# Patient Record
Sex: Female | Born: 1935 | Race: White | Hispanic: No | Marital: Single | Smoking: Former smoker
Health system: Southern US, Community
[De-identification: ages and names within clinical notes are randomized; demographics above are authoritative.]

## PROBLEM LIST (undated history)

## (undated) DIAGNOSIS — E119 Type 2 diabetes mellitus without complications: Secondary | ICD-10-CM

## (undated) DIAGNOSIS — R1110 Vomiting, unspecified: Secondary | ICD-10-CM

## (undated) DIAGNOSIS — Z87440 Personal history of urinary (tract) infections: Secondary | ICD-10-CM

## (undated) DIAGNOSIS — G629 Polyneuropathy, unspecified: Secondary | ICD-10-CM

## (undated) DIAGNOSIS — M810 Age-related osteoporosis without current pathological fracture: Secondary | ICD-10-CM

## (undated) DIAGNOSIS — M549 Dorsalgia, unspecified: Secondary | ICD-10-CM

## (undated) DIAGNOSIS — F0390 Unspecified dementia without behavioral disturbance: Secondary | ICD-10-CM

## (undated) DIAGNOSIS — M1990 Unspecified osteoarthritis, unspecified site: Secondary | ICD-10-CM

## (undated) DIAGNOSIS — I509 Heart failure, unspecified: Secondary | ICD-10-CM

## (undated) DIAGNOSIS — I10 Essential (primary) hypertension: Secondary | ICD-10-CM

## (undated) DIAGNOSIS — G5600 Carpal tunnel syndrome, unspecified upper limb: Secondary | ICD-10-CM

## (undated) DIAGNOSIS — K759 Inflammatory liver disease, unspecified: Secondary | ICD-10-CM

## (undated) DIAGNOSIS — I639 Cerebral infarction, unspecified: Secondary | ICD-10-CM

## (undated) DIAGNOSIS — E785 Hyperlipidemia, unspecified: Secondary | ICD-10-CM

## (undated) DIAGNOSIS — K219 Gastro-esophageal reflux disease without esophagitis: Secondary | ICD-10-CM

## (undated) DIAGNOSIS — K5090 Crohn's disease, unspecified, without complications: Secondary | ICD-10-CM

## (undated) HISTORY — DX: Polyneuropathy, unspecified: G62.9

## (undated) HISTORY — DX: Carpal tunnel syndrome, unspecified upper limb: G56.00

## (undated) HISTORY — PX: ELBOW SURGERY: SHX618

## (undated) HISTORY — PX: ABDOMINAL SURGERY: SHX537

## (undated) HISTORY — DX: Heart failure, unspecified: I50.9

## (undated) HISTORY — PX: EYE SURGERY: SHX253

## (undated) HISTORY — DX: Personal history of urinary (tract) infections: Z87.440

## (undated) HISTORY — PX: COLONOSCOPY: SHX174

## (undated) HISTORY — PX: KNEE SURGERY: SHX244

---

## 2001-10-30 ENCOUNTER — Encounter: Admission: RE | Admit: 2001-10-30 | Discharge: 2001-10-30 | Payer: Self-pay

## 2001-11-01 ENCOUNTER — Emergency Department (HOSPITAL_COMMUNITY): Admission: EM | Admit: 2001-11-01 | Discharge: 2001-11-02 | Payer: Self-pay | Admitting: Emergency Medicine

## 2001-11-04 ENCOUNTER — Inpatient Hospital Stay (HOSPITAL_COMMUNITY): Admission: AD | Admit: 2001-11-04 | Discharge: 2001-11-12 | Payer: Self-pay | Admitting: Internal Medicine

## 2012-05-14 ENCOUNTER — Encounter (INDEPENDENT_AMBULATORY_CARE_PROVIDER_SITE_OTHER): Payer: Self-pay | Admitting: Ophthalmology

## 2012-05-22 ENCOUNTER — Encounter (INDEPENDENT_AMBULATORY_CARE_PROVIDER_SITE_OTHER): Payer: Medicare Other | Admitting: Ophthalmology

## 2012-05-22 DIAGNOSIS — I10 Essential (primary) hypertension: Secondary | ICD-10-CM | POA: Diagnosis not present

## 2012-06-06 ENCOUNTER — Other Ambulatory Visit (INDEPENDENT_AMBULATORY_CARE_PROVIDER_SITE_OTHER): Payer: Medicare Other | Admitting: Ophthalmology

## 2012-06-06 DIAGNOSIS — E1139 Type 2 diabetes mellitus with other diabetic ophthalmic complication: Secondary | ICD-10-CM

## 2012-06-06 DIAGNOSIS — E11359 Type 2 diabetes mellitus with proliferative diabetic retinopathy without macular edema: Secondary | ICD-10-CM

## 2012-06-23 ENCOUNTER — Other Ambulatory Visit (INDEPENDENT_AMBULATORY_CARE_PROVIDER_SITE_OTHER): Payer: Medicare Other | Admitting: Ophthalmology

## 2012-06-23 DIAGNOSIS — E1139 Type 2 diabetes mellitus with other diabetic ophthalmic complication: Secondary | ICD-10-CM

## 2012-06-23 DIAGNOSIS — E11359 Type 2 diabetes mellitus with proliferative diabetic retinopathy without macular edema: Secondary | ICD-10-CM

## 2012-08-18 ENCOUNTER — Encounter (HOSPITAL_COMMUNITY): Payer: Self-pay

## 2012-08-18 ENCOUNTER — Emergency Department (HOSPITAL_COMMUNITY): Payer: Medicare Other

## 2012-08-18 ENCOUNTER — Emergency Department (HOSPITAL_COMMUNITY)
Admission: EM | Admit: 2012-08-18 | Discharge: 2012-08-18 | Disposition: A | Discharge status: Home / Self Care | Payer: Medicare Other | Attending: Emergency Medicine | Admitting: Emergency Medicine

## 2012-08-18 DIAGNOSIS — Z87891 Personal history of nicotine dependence: Secondary | ICD-10-CM | POA: Insufficient documentation

## 2012-08-18 DIAGNOSIS — Z79899 Other long term (current) drug therapy: Secondary | ICD-10-CM | POA: Insufficient documentation

## 2012-08-18 DIAGNOSIS — K219 Gastro-esophageal reflux disease without esophagitis: Secondary | ICD-10-CM | POA: Insufficient documentation

## 2012-08-18 DIAGNOSIS — M545 Low back pain, unspecified: Secondary | ICD-10-CM | POA: Insufficient documentation

## 2012-08-18 DIAGNOSIS — M549 Dorsalgia, unspecified: Secondary | ICD-10-CM

## 2012-08-18 DIAGNOSIS — E119 Type 2 diabetes mellitus without complications: Secondary | ICD-10-CM | POA: Insufficient documentation

## 2012-08-18 DIAGNOSIS — Z794 Long term (current) use of insulin: Secondary | ICD-10-CM | POA: Insufficient documentation

## 2012-08-18 DIAGNOSIS — M810 Age-related osteoporosis without current pathological fracture: Secondary | ICD-10-CM | POA: Insufficient documentation

## 2012-08-18 DIAGNOSIS — Z8781 Personal history of (healed) traumatic fracture: Secondary | ICD-10-CM | POA: Insufficient documentation

## 2012-08-18 HISTORY — DX: Gastro-esophageal reflux disease without esophagitis: K21.9

## 2012-08-18 HISTORY — DX: Age-related osteoporosis without current pathological fracture: M81.0

## 2012-08-18 HISTORY — DX: Type 2 diabetes mellitus without complications: E11.9

## 2012-08-18 MED ORDER — HYDROCODONE-ACETAMINOPHEN 5-325 MG PO TABS
1.0000 | ORAL_TABLET | Freq: Once | ORAL | Status: AC
  Administered 2012-08-18: 1 via ORAL
  Filled 2012-08-18: qty 1

## 2012-08-18 MED ORDER — HYDROCODONE-ACETAMINOPHEN 5-325 MG PO TABS
1.0000 | ORAL_TABLET | Freq: Four times a day (QID) | ORAL | Status: DC | PRN
Start: 2012-08-18 — End: 2012-11-05

## 2012-08-18 NOTE — Progress Notes (Signed)
   CARE MANAGEMENT ED NOTE 08/18/2012  Patient:  MARCHE, HOTTENSTEIN   Account Number:  [id number]  Date Initiated:  08/18/2012  Documentation initiated by:  Radford Pax  Subjective/Objective Assessment:   Patient presented to ED with lower back pain     Subjective/Objective Assessment Detail:     Action/Plan:   Action/Plan Detail:   Anticipated DC Date:       Status Recommendation to Physician:   Result of Recommendation:    Other ED Services  Consult Working Plan    DC Planning Services  Other  PCP issues    Choice offered to / List presented to:            Status of service:  Completed, signed off  ED Comments:   ED Comments Detail:  Patient listed as not having a pcp.  EDCM spoke to patient who stated that Dr. Thea Silversmith off of [address] is her pcp.  Family at bedside.  Offered support to patient and family.  No further needs at this time.

## 2012-08-18 NOTE — ED Provider Notes (Signed)
History     CSN: [id number]  Arrival date & time 08/18/12  1243   First MD Initiated Contact with Patient 08/18/12 1505      Chief Complaint  Patient presents with  . Back Pain    (Consider location/radiation/quality/duration/timing/severity/associated sxs/prior treatment) HPI Pt presenting with c/o low back pain.  Pt states she has had hx of low back pain over the past 10 years.  Has hx of 4 lumbar fractures 10 years ago.  This current episode of back pain began approx 1 week ago- she was getting groceries out of her car and had acute onset of back pain.  She denies urinary retention, no weakness of legs, no incontinence of bowel or bladder.  No fever.  She was seen by her PMD and was given rx for muscle relaxer- but she states this has not helped.  She has been taking tramadol chronically for pain over the past several years.  There are no other associated systemic symptoms, there are no other alleviating or modifying factors.   Past Medical History  Diagnosis Date  . Osteoporosis   . Diabetes mellitus without complication   . GERD (gastroesophageal reflux disease)     Past Surgical History  Procedure Laterality Date  . Knee surgery Right   . Elbow surgery Left   . Eye surgery    . Abdominal surgery      History reviewed. No pertinent family history.  History  Substance Use Topics  . Smoking status: Former Games developer  . Smokeless tobacco: Never Used  . Alcohol Use: No    OB History   Grav Para Term Preterm Abortions TAB SAB Ect Mult Living                  Review of Systems ROS reviewed and all otherwise negative except for mentioned in HPI  Allergies  Review of patient's allergies indicates no known allergies.  Home Medications   Current Outpatient Rx  Name  Route  Sig  Dispense  Refill  . amLODipine (NORVASC) 5 MG tablet   Oral   Take 5 mg by mouth daily.         Marland Kitchen aspirin EC 81 MG tablet   Oral   Take 81 mg by mouth daily.         Marland Kitchen atenolol  (TENORMIN) 25 MG tablet   Oral   Take 25 mg by mouth daily.         . Calcium Citrate (CITRACAL PO)   Oral   Take 1,200 Units by mouth daily.         . Cholecalciferol (VITAMIN D3) 2000 UNITS TABS   Oral   Take 1 tablet by mouth daily.         . cyclobenzaprine (FLEXERIL) 10 MG tablet   Oral   Take 5 mg by mouth 3 (three) times daily as needed for muscle spasms (pain).         . ferrous fumarate (HEMOCYTE - 106 MG FE) 325 (106 FE) MG TABS   Oral   Take 1 tablet by mouth every other day.         . furosemide (LASIX) 20 MG tablet   Oral   Take 20 mg by mouth every other day.         . ibuprofen (ADVIL,MOTRIN) 200 MG tablet   Oral   Take 400 mg by mouth every 6 (six) hours as needed for pain (pain).         Marland Kitchen  insulin aspart (NOVOLOG) 100 UNIT/ML injection   Subcutaneous   Inject 4-6 Units into the skin 3 (three) times daily with meals. 5units in am, 4units at lunch, and 6units in pm         . insulin glargine (LANTUS) 100 UNIT/ML injection   Subcutaneous   Inject 18 Units into the skin daily. Takes in the am         . lansoprazole (PREVACID) 30 MG capsule   Oral   Take 30 mg by mouth daily.         . prochlorperazine (COMPAZINE) 5 MG tablet   Oral   Take 5 mg by mouth every 6 (six) hours as needed for nausea (nausea).         . traMADol (ULTRAM) 50 MG tablet   Oral   Take 50 mg by mouth every 6 (six) hours as needed for pain (pain).         Marland Kitchen HYDROcodone-acetaminophen (NORCO/VICODIN) 5-325 MG per tablet   Oral   Take 1 tablet by mouth every 6 (six) hours as needed for pain.   20 tablet   0     BP 143/66  Pulse 84  Temp(Src) 98.7 F (37.1 C) (Oral)  Resp 16  Ht 4\' 9"  (1.448 m)  Wt 122 lb (55.339 kg)  BMI 26.39 kg/m2  SpO2 95% Vitals reviewed Physical Exam Physical Examination: General appearance - alert, well appearing, and in no distress Mental status - alert, oriented to person, place, and time Eyes - no conjunctival  injection, no scleral icterus Mouth - mucous membranes moist, pharynx normal without lesions Neck - no ttp over midline cervical region, FROM wtihout pain Chest - clear to auscultation, no wheezes, rales or rhonchi, symmetric air entry Heart - normal rate, regular rhythm, normal S1, S2, no murmurs, rubs, clicks or gallops Abdomen - soft, nontender, nondistended, no masses or organomegaly Back exam - ttp over lumbar region in the midline, also right parasapinous tenderness, no CVA tenderness Neurological - alert, oriented, normal speech, strength 5/5 in lower extremities bilaterally, sensation intact distally in lower extremites Extremities - peripheral pulses normal, no pedal edema, no clubbing or cyanosis Skin - normal coloration and turgor, no rashes  ED Course  Procedures (including critical care time)  Labs Reviewed - No data to display Dg Lumbar Spine Complete  08/18/2012   *RADIOLOGY REPORT*  Clinical Data: Chronic low back pain.  LUMBAR SPINE - COMPLETE 4+ VIEW  Comparison: None.  Findings: Five non-rib bearing lumbar vertebrae.  Approximately 30% compression deformity of the L3 vertebral body with kyphoplasty material.  Approximately 70% compression deformity of the L4 vertebral body with kyphoplasty material.  Approximately 60% compression deformity of the L1 vertebral body with mild bony retropulsion and no acute fracture lines.  This is fused with an approximately 25% compressed T12 vertebral body.  There is also an approximately 30% T10 vertebral compression deformity.  No subluxations are seen.  No pars defects.  Atheromatous arterial calcifications.  Facet degenerative changes at multiple levels.  IMPRESSION:  1.  Multiple vertebral compression fractures, as described above, with no visible acute fracture lines. 2.  Multilevel facet degenerative changes.   Original Report Authenticated By: Beckie Salts, M.D.     1. Back pain       MDM  Pt with hx of chronic back pain and  chronic compression fractures in mutliple lumbar vertebrae presents with increased back pain over the past week.  Muscle relaxers have not been helping.  Xray shows chronic compression fractures without acute change or loss of height.  Pt states hydrocodone has helped her pain.  She has no symptoms of cauda equina or epidural abscess.  All results d/w patient and family at bedside and questions answered. Discharged with strict return precautions.  Pt agreeable with plan.        Ethelda Chick, MD 08/18/12 [phone]

## 2012-08-18 NOTE — ED Notes (Signed)
Pt escorted to discharge window. Verbalized understanding discharge instructions. In no acute distress.   

## 2012-08-18 NOTE — ED Notes (Signed)
Patient transported to X-ray 

## 2012-08-18 NOTE — ED Notes (Addendum)
Pt c/o intermittent low back pain x "over a week."  Pain score 10/10.  Sts "I was bending over to get groceries out of the back seat of my car and I couldn't stand back up."  Sts she fell forward into the back seat.  Sts she was seen by PCP, last Wednesday, told that it was musculoskeletal and prescribed muscle relaxers.  Sts no relief with medication.  Sts unable to ambulate independently and unable to get "up and down" without assistance.  Denies numbness and tingling.  Pt sts fractures in L3-L6.  Sts "they put cement in, but it didn't help."

## 2012-08-28 ENCOUNTER — Other Ambulatory Visit: Payer: Self-pay | Admitting: Internal Medicine

## 2012-08-28 DIAGNOSIS — R0989 Other specified symptoms and signs involving the circulatory and respiratory systems: Secondary | ICD-10-CM

## 2012-10-01 ENCOUNTER — Ambulatory Visit
Admission: RE | Admit: 2012-10-01 | Discharge: 2012-10-01 | Disposition: A | Discharge status: Home / Self Care | Payer: Medicare Other | Source: Ambulatory Visit | Attending: Internal Medicine | Admitting: Internal Medicine

## 2012-10-01 DIAGNOSIS — R0989 Other specified symptoms and signs involving the circulatory and respiratory systems: Secondary | ICD-10-CM

## 2012-10-24 ENCOUNTER — Ambulatory Visit (INDEPENDENT_AMBULATORY_CARE_PROVIDER_SITE_OTHER): Payer: Medicare Other | Admitting: Ophthalmology

## 2012-11-05 ENCOUNTER — Emergency Department (HOSPITAL_COMMUNITY): Payer: Medicare Other

## 2012-11-05 ENCOUNTER — Encounter (HOSPITAL_COMMUNITY): Payer: Self-pay

## 2012-11-05 ENCOUNTER — Emergency Department (HOSPITAL_COMMUNITY)
Admission: EM | Admit: 2012-11-05 | Discharge: 2012-11-05 | Disposition: A | Discharge status: Home / Self Care | Payer: Medicare Other | Attending: Emergency Medicine | Admitting: Emergency Medicine

## 2012-11-05 DIAGNOSIS — Z87891 Personal history of nicotine dependence: Secondary | ICD-10-CM | POA: Insufficient documentation

## 2012-11-05 DIAGNOSIS — W19XXXA Unspecified fall, initial encounter: Secondary | ICD-10-CM

## 2012-11-05 DIAGNOSIS — S20229A Contusion of unspecified back wall of thorax, initial encounter: Secondary | ICD-10-CM | POA: Insufficient documentation

## 2012-11-05 DIAGNOSIS — G8929 Other chronic pain: Secondary | ICD-10-CM | POA: Insufficient documentation

## 2012-11-05 DIAGNOSIS — R51 Headache: Secondary | ICD-10-CM | POA: Insufficient documentation

## 2012-11-05 DIAGNOSIS — Z7982 Long term (current) use of aspirin: Secondary | ICD-10-CM | POA: Insufficient documentation

## 2012-11-05 DIAGNOSIS — Z79899 Other long term (current) drug therapy: Secondary | ICD-10-CM | POA: Insufficient documentation

## 2012-11-05 DIAGNOSIS — Z794 Long term (current) use of insulin: Secondary | ICD-10-CM | POA: Insufficient documentation

## 2012-11-05 DIAGNOSIS — Y9301 Activity, walking, marching and hiking: Secondary | ICD-10-CM | POA: Insufficient documentation

## 2012-11-05 DIAGNOSIS — Y9289 Other specified places as the place of occurrence of the external cause: Secondary | ICD-10-CM | POA: Insufficient documentation

## 2012-11-05 DIAGNOSIS — E119 Type 2 diabetes mellitus without complications: Secondary | ICD-10-CM | POA: Insufficient documentation

## 2012-11-05 DIAGNOSIS — K219 Gastro-esophageal reflux disease without esophagitis: Secondary | ICD-10-CM | POA: Insufficient documentation

## 2012-11-05 DIAGNOSIS — T07XXXA Unspecified multiple injuries, initial encounter: Secondary | ICD-10-CM

## 2012-11-05 DIAGNOSIS — W010XXA Fall on same level from slipping, tripping and stumbling without subsequent striking against object, initial encounter: Secondary | ICD-10-CM | POA: Insufficient documentation

## 2012-11-05 DIAGNOSIS — M810 Age-related osteoporosis without current pathological fracture: Secondary | ICD-10-CM | POA: Insufficient documentation

## 2012-11-05 HISTORY — DX: Dorsalgia, unspecified: M54.9

## 2012-11-05 MED ORDER — HYDROCODONE-ACETAMINOPHEN 5-325 MG PO TABS
1.0000 | ORAL_TABLET | Freq: Once | ORAL | Status: AC
  Administered 2012-11-05: 1 via ORAL
  Filled 2012-11-05: qty 1

## 2012-11-05 MED ORDER — HYDROCODONE-ACETAMINOPHEN 5-325 MG PO TABS: 2.0000 | ORAL_TABLET | ORAL | Status: DC | PRN

## 2012-11-05 NOTE — ED Notes (Signed)
CBG 100 

## 2012-11-05 NOTE — ED Notes (Signed)
Patient transported to X-ray 

## 2012-11-05 NOTE — ED Notes (Signed)
Pt did not have IV.

## 2012-11-05 NOTE — ED Provider Notes (Signed)
CSN: [id number]     Arrival date & time 11/05/12  1518 History   First MD Initiated Contact with Patient 11/05/12 1530     Chief Complaint  Patient presents with  . Fall   (Consider location/radiation/quality/duration/timing/severity/associated sxs/prior Treatment) Patient is a 79 y.o. female presenting with fall. The history is provided by the patient.  Fall This is a new problem. The current episode started less than 1 hour ago. The problem occurs every several days. The problem has not changed since onset.Associated symptoms include headaches. Pertinent negatives include no abdominal pain and no shortness of breath.  pt tripped on a curb, no head injury or loc--h/o chronic back pain and pain to thoracic and lumbar area worse, denies LE weakness or paredthesisas--no change in bowel/bladder fx--ems called and pt placed on backboard and head blocks and transported here--notes midline cervical neck pain without UE numbness or weakness--pain is described as sharp and worse with movement--no meds for this taken pta  Past Medical History  Diagnosis Date  . Osteoporosis   . Diabetes mellitus without complication   . GERD (gastroesophageal reflux disease)    Past Surgical History  Procedure Laterality Date  . Knee surgery Right   . Elbow surgery Left   . Eye surgery    . Abdominal surgery     No family history on file. History  Substance Use Topics  . Smoking status: Former Games developer  . Smokeless tobacco: Never Used  . Alcohol Use: No   OB History   Grav Para Term Preterm Abortions TAB SAB Ect Mult Living                 Review of Systems  Respiratory: Negative for shortness of breath.   Gastrointestinal: Negative for abdominal pain.  Neurological: Positive for headaches.  All other systems reviewed and are negative.    Allergies  Review of patient's allergies indicates no known allergies.  Home Medications   Current Outpatient Rx  Name  Route  Sig  Dispense  Refill  .  amLODipine (NORVASC) 5 MG tablet   Oral   Take 5 mg by mouth daily.         Marland Kitchen aspirin EC 81 MG tablet   Oral   Take 81 mg by mouth daily.         Marland Kitchen atenolol (TENORMIN) 25 MG tablet   Oral   Take 25 mg by mouth daily.         . Calcium Citrate (CITRACAL PO)   Oral   Take 1,200 Units by mouth daily.         . Cholecalciferol (VITAMIN D3) 2000 UNITS TABS   Oral   Take 1 tablet by mouth daily.         . cyclobenzaprine (FLEXERIL) 10 MG tablet   Oral   Take 5 mg by mouth 3 (three) times daily as needed for muscle spasms (pain).         . ferrous fumarate (HEMOCYTE - 106 MG FE) 325 (106 FE) MG TABS   Oral   Take 1 tablet by mouth every other day.         . furosemide (LASIX) 20 MG tablet   Oral   Take 20 mg by mouth every other day.         Marland Kitchen HYDROcodone-acetaminophen (NORCO/VICODIN) 5-325 MG per tablet   Oral   Take 1 tablet by mouth every 6 (six) hours as needed for pain.   20 tablet  0   . ibuprofen (ADVIL,MOTRIN) 200 MG tablet   Oral   Take 400 mg by mouth every 6 (six) hours as needed for pain (pain).         . insulin aspart (NOVOLOG) 100 UNIT/ML injection   Subcutaneous   Inject 4-6 Units into the skin 3 (three) times daily with meals. 5units in am, 4units at lunch, and 6units in pm         . insulin glargine (LANTUS) 100 UNIT/ML injection   Subcutaneous   Inject 18 Units into the skin daily. Takes in the am         . lansoprazole (PREVACID) 30 MG capsule   Oral   Take 30 mg by mouth daily.         . prochlorperazine (COMPAZINE) 5 MG tablet   Oral   Take 5 mg by mouth every 6 (six) hours as needed for nausea (nausea).         . traMADol (ULTRAM) 50 MG tablet   Oral   Take 50 mg by mouth every 6 (six) hours as needed for pain (pain).          BP 134/54  Pulse 57  Temp(Src) 97.5 F (36.4 C) (Oral)  Resp 14  SpO2 93% Physical Exam  Nursing note and vitals reviewed. Constitutional: She is oriented to person, place, and  time. She appears well-developed and well-nourished.  Non-toxic appearance. No distress.  HENT:  Head: Normocephalic and atraumatic.  Eyes: Conjunctivae, EOM and lids are normal. Pupils are equal, round, and reactive to light.  Neck: Normal range of motion. Neck supple. Spinous process tenderness and muscular tenderness present. No tracheal deviation present. No mass present.    Cardiovascular: Normal rate, regular rhythm and normal heart sounds.  Exam reveals no gallop.   No murmur heard. Pulmonary/Chest: Effort normal and breath sounds normal. No stridor. No respiratory distress. She has no decreased breath sounds. She has no wheezes. She has no rhonchi. She has no rales.  Abdominal: Soft. Normal appearance and bowel sounds are normal. She exhibits no distension. There is no tenderness. There is no rebound and no CVA tenderness.  Musculoskeletal: Normal range of motion. She exhibits no edema and no tenderness.       Back:  Neurological: She is alert and oriented to person, place, and time. She has normal strength. No cranial nerve deficit or sensory deficit. GCS eye subscore is 4. GCS verbal subscore is 5. GCS motor subscore is 6.  Skin: Skin is warm and dry. No abrasion and no rash noted.  Psychiatric: She has a normal mood and affect. Her speech is normal and behavior is normal.    ED Course  Procedures (including critical care time) Labs Review Labs Reviewed  GLUCOSE, CAPILLARY - Abnormal; Notable for the following:    Glucose-Capillary 59 (*)    All other components within normal limits   Imaging Review No results found.  MDM  No diagnosis found. Patient's x-rays was negative acute fractures. Given Vicodin for pain here and will be given prescription for same and will followup with her Dr.    Toy Baker, MD 11/05/12 [phone]

## 2012-11-05 NOTE — ED Notes (Signed)
Per EMS- Patient was walking with a cane and lost her balance when attempting to step up on a curb. Patient fell backlanding on her back. Patient c/o increased back pain, but denies hitting her head or having LOC. Patient has a history of chronic back pain and states the pain is much worse.

## 2012-11-05 NOTE — ED Notes (Signed)
Bed: WU98 Expected date:  Expected time:  Means of arrival:  Comments: Fall, back pain

## 2012-11-10 NOTE — Progress Notes (Signed)
EDCM notified to contact patient Janet Frazier daughter Janet Frazier at [phone].  EDCM called Janet Frazier.  As per Janet Frazier, patient was in the ED on Wednesday after having a fall.  She reports she asked physician in ED to have PT at home, but the physician in the ED told her to contact the patient's pcp.  As per Janet Frazier, she called her mother's pcp and he stated that he would not be able to order anything unless he saw the patient first.  Janet Frazier reports that she has hired someone to help her mother in the home but,  "It's like a babysitter and she needs more than that."  [facility] provided Janet Frazier with phone numbers for [facility] and Physician Home visits.  EDCM also provided phone numbers for the following home health agencies: [facility], [place], [place] and [place].  Instructed patient's daughter to call the home health agencies and inform them of her situation and that she is interested in having home health care.  Janet Frazier thankful for resources.  No further needs at this time.

## 2012-11-12 ENCOUNTER — Ambulatory Visit (INDEPENDENT_AMBULATORY_CARE_PROVIDER_SITE_OTHER): Payer: Medicare Other | Admitting: Ophthalmology

## 2012-11-12 ENCOUNTER — Emergency Department (HOSPITAL_COMMUNITY): Payer: Medicare Other

## 2012-11-12 ENCOUNTER — Observation Stay (HOSPITAL_COMMUNITY)
Admission: EM | Admit: 2012-11-12 | Discharge: 2012-11-14 | Disposition: A | Discharge status: Home / Self Care | Payer: Medicare Other | Attending: Internal Medicine | Admitting: Internal Medicine

## 2012-11-12 ENCOUNTER — Encounter (HOSPITAL_COMMUNITY): Payer: Self-pay

## 2012-11-12 DIAGNOSIS — R296 Repeated falls: Secondary | ICD-10-CM

## 2012-11-12 DIAGNOSIS — X58XXXA Exposure to other specified factors, initial encounter: Secondary | ICD-10-CM | POA: Insufficient documentation

## 2012-11-12 DIAGNOSIS — E119 Type 2 diabetes mellitus without complications: Secondary | ICD-10-CM | POA: Diagnosis present

## 2012-11-12 DIAGNOSIS — Z7982 Long term (current) use of aspirin: Secondary | ICD-10-CM | POA: Insufficient documentation

## 2012-11-12 DIAGNOSIS — G8929 Other chronic pain: Principal | ICD-10-CM | POA: Insufficient documentation

## 2012-11-12 DIAGNOSIS — Z794 Long term (current) use of insulin: Secondary | ICD-10-CM | POA: Insufficient documentation

## 2012-11-12 DIAGNOSIS — I517 Cardiomegaly: Secondary | ICD-10-CM | POA: Insufficient documentation

## 2012-11-12 DIAGNOSIS — E860 Dehydration: Secondary | ICD-10-CM | POA: Insufficient documentation

## 2012-11-12 DIAGNOSIS — R079 Chest pain, unspecified: Secondary | ICD-10-CM | POA: Insufficient documentation

## 2012-11-12 DIAGNOSIS — S22009A Unspecified fracture of unspecified thoracic vertebra, initial encounter for closed fracture: Secondary | ICD-10-CM | POA: Insufficient documentation

## 2012-11-12 DIAGNOSIS — R5381 Other malaise: Secondary | ICD-10-CM | POA: Insufficient documentation

## 2012-11-12 DIAGNOSIS — M549 Dorsalgia, unspecified: Secondary | ICD-10-CM | POA: Diagnosis present

## 2012-11-12 DIAGNOSIS — K219 Gastro-esophageal reflux disease without esophagitis: Secondary | ICD-10-CM | POA: Insufficient documentation

## 2012-11-12 DIAGNOSIS — S22000A Wedge compression fracture of unspecified thoracic vertebra, initial encounter for closed fracture: Secondary | ICD-10-CM

## 2012-11-12 DIAGNOSIS — Z9181 History of falling: Secondary | ICD-10-CM | POA: Insufficient documentation

## 2012-11-12 DIAGNOSIS — M4000 Postural kyphosis, site unspecified: Secondary | ICD-10-CM | POA: Insufficient documentation

## 2012-11-12 DIAGNOSIS — I10 Essential (primary) hypertension: Secondary | ICD-10-CM | POA: Diagnosis present

## 2012-11-12 DIAGNOSIS — M810 Age-related osteoporosis without current pathological fracture: Secondary | ICD-10-CM | POA: Insufficient documentation

## 2012-11-12 DIAGNOSIS — Z79899 Other long term (current) drug therapy: Secondary | ICD-10-CM | POA: Insufficient documentation

## 2012-11-12 DIAGNOSIS — E876 Hypokalemia: Secondary | ICD-10-CM | POA: Diagnosis present

## 2012-11-12 LAB — BASIC METABOLIC PANEL
BUN: 20 mg/dL (ref 6–23)
Creatinine, Ser: 0.52 mg/dL (ref 0.50–1.10)
GFR calc Af Amer: >90 mL/min (ref >90)
GFR calc non Af Amer: 88 mL/min — ABNORMAL LOW (ref >90)
Glucose, Bld: 197 mg/dL — ABNORMAL HIGH (ref 70–99)

## 2012-11-12 LAB — CBC WITH DIFFERENTIAL/PLATELET
Hemoglobin: 13.8 g/dL (ref 12.0–15.0)
Lymphocytes Relative: 20 % (ref 12–46)
Lymphs Abs: 2 10*3/uL (ref 0.7–4.0)
MCV: 84.9 fL (ref 78.0–100.0)
Monocytes Relative: 9 % (ref 3–12)
Neutrophils Relative %: 72 % (ref 43–77)
Platelets: 258 10*3/uL (ref 150–400)
RBC: 4.71 MIL/uL (ref 3.87–5.11)
WBC: 10.2 10*3/uL (ref 4.0–10.5)

## 2012-11-12 LAB — GLUCOSE, CAPILLARY

## 2012-11-12 LAB — URINALYSIS, ROUTINE W REFLEX MICROSCOPIC
Glucose, UA: 250 mg/dL — AB
Hgb urine dipstick: NEGATIVE
Leukocytes, UA: NEGATIVE
pH: 6 (ref 5.0–8.0)

## 2012-11-12 LAB — URINE MICROSCOPIC-ADD ON

## 2012-11-12 MED ORDER — AMLODIPINE BESYLATE 5 MG PO TABS
5.0000 mg | ORAL_TABLET | Freq: Every day | ORAL | Status: DC
  Administered 2012-11-12: 5 mg via ORAL
  Filled 2012-11-12 (×2): qty 1

## 2012-11-12 MED ORDER — INSULIN ASPART 100 UNIT/ML ~~LOC~~ SOLN
0.0000 [IU] | Freq: Three times a day (TID) | SUBCUTANEOUS | Status: DC
  Administered 2012-11-13: 2 [IU] via SUBCUTANEOUS
  Administered 2012-11-13: 3 [IU] via SUBCUTANEOUS
  Administered 2012-11-13: 2 [IU] via SUBCUTANEOUS
  Administered 2012-11-14: 5 [IU] via SUBCUTANEOUS
  Administered 2012-11-14: 1 [IU] via SUBCUTANEOUS

## 2012-11-12 MED ORDER — POTASSIUM CHLORIDE CRYS ER 20 MEQ PO TBCR
40.0000 meq | EXTENDED_RELEASE_TABLET | Freq: Once | ORAL | Status: AC
  Administered 2012-11-12: 40 meq via ORAL
  Filled 2012-11-12: qty 2

## 2012-11-12 MED ORDER — OXYCODONE HCL 5 MG PO TABS
5.0000 mg | ORAL_TABLET | Freq: Four times a day (QID) | ORAL | Status: DC | PRN
  Administered 2012-11-12 – 2012-11-14 (×5): 5 mg via ORAL
  Filled 2012-11-12 (×5): qty 1

## 2012-11-12 MED ORDER — PANTOPRAZOLE SODIUM 20 MG PO TBEC
20.0000 mg | DELAYED_RELEASE_TABLET | Freq: Every day | ORAL | Status: DC
  Filled 2012-11-12: qty 1

## 2012-11-12 MED ORDER — SODIUM CHLORIDE 0.9 % IV SOLN
INTRAVENOUS | Status: DC
  Administered 2012-11-12: 18:00:00 via INTRAVENOUS

## 2012-11-12 MED ORDER — ENOXAPARIN SODIUM 40 MG/0.4ML ~~LOC~~ SOLN
40.0000 mg | SUBCUTANEOUS | Status: DC
  Administered 2012-11-12 – 2012-11-13 (×2): 40 mg via SUBCUTANEOUS
  Filled 2012-11-12 (×3): qty 0.4

## 2012-11-12 MED ORDER — FENTANYL CITRATE 0.05 MG/ML IJ SOLN
25.0000 ug | Freq: Once | INTRAMUSCULAR | Status: AC
  Administered 2012-11-12: 25 ug via INTRAVENOUS
  Filled 2012-11-12: qty 2

## 2012-11-12 MED ORDER — ATENOLOL 25 MG PO TABS
25.0000 mg | ORAL_TABLET | Freq: Every day | ORAL | Status: DC
  Administered 2012-11-12 – 2012-11-14 (×3): 25 mg via ORAL
  Filled 2012-11-12 (×3): qty 1

## 2012-11-12 MED ORDER — ACETAMINOPHEN 650 MG RE SUPP: 650.0000 mg | Freq: Four times a day (QID) | RECTAL | Status: DC | PRN

## 2012-11-12 MED ORDER — ONDANSETRON HCL 4 MG/2ML IJ SOLN: 4.0000 mg | Freq: Four times a day (QID) | INTRAMUSCULAR | Status: DC | PRN

## 2012-11-12 MED ORDER — CYCLOBENZAPRINE HCL 5 MG PO TABS
5.0000 mg | ORAL_TABLET | Freq: Three times a day (TID) | ORAL | Status: DC | PRN
  Filled 2012-11-12: qty 1

## 2012-11-12 MED ORDER — ASPIRIN EC 81 MG PO TBEC
81.0000 mg | DELAYED_RELEASE_TABLET | ORAL | Status: DC
  Administered 2012-11-12 – 2012-11-14 (×2): 81 mg via ORAL
  Filled 2012-11-12 (×2): qty 1

## 2012-11-12 MED ORDER — INSULIN GLARGINE 100 UNIT/ML ~~LOC~~ SOLN
10.0000 [IU] | Freq: Every day | SUBCUTANEOUS | Status: DC
  Administered 2012-11-13 – 2012-11-14 (×2): 10 [IU] via SUBCUTANEOUS
  Filled 2012-11-12 (×2): qty 0.1

## 2012-11-12 MED ORDER — ACETAMINOPHEN 325 MG PO TABS: 650.0000 mg | ORAL_TABLET | Freq: Four times a day (QID) | ORAL | Status: DC | PRN

## 2012-11-12 MED ORDER — SODIUM CHLORIDE 0.9 % IV SOLN
INTRAVENOUS | Status: DC
  Administered 2012-11-12 – 2012-11-14 (×3): via INTRAVENOUS

## 2012-11-12 MED ORDER — ONDANSETRON HCL 4 MG PO TABS: 4.0000 mg | ORAL_TABLET | Freq: Four times a day (QID) | ORAL | Status: DC | PRN

## 2012-11-12 NOTE — ED Provider Notes (Signed)
CSN: [id number]     Arrival date & time 11/12/12  1504 History   First MD Initiated Contact with Patient 11/12/12 1533     Chief Complaint  Patient presents with  . Back Pain   (Consider location/radiation/quality/duration/timing/severity/associated sxs/prior Treatment) Patient is a 79 y.o. female presenting with back pain. The history is provided by the patient and a relative.  Back Pain Associated symptoms: no abdominal pain, no chest pain, no fever, no headaches, no numbness and no weakness    patient had a fall around a week ago. She has chronic back pain is usually well-controlled the tramadol. She had x-rays that did not show any acute fracture. She was discharged with Vicodin. She's continued to have pain at home. She's been minimally and auditory do to it. She states the pain limits her. She's also had decreased energy. Her sugars have been more difficult to control. Been going down to the 60s. She's had decreased appetite. She's had an occasional cough. No dysuria. No urinary incontinence. No abdominal pain. No chest pain. She's normally could either. Patient's family states that her primary care Dr., Dr. Thea Silversmith stated that she needed to come in to the ER for 3 days so she can't place to the nursing home or rehabilitation.  Past Medical History  Diagnosis Date  . Osteoporosis   . Diabetes mellitus without complication   . GERD (gastroesophageal reflux disease)   . Back pain    Past Surgical History  Procedure Laterality Date  . Knee surgery Right   . Elbow surgery Left   . Eye surgery    . Abdominal surgery     History reviewed. No pertinent family history. History  Substance Use Topics  . Smoking status: Former Games developer  . Smokeless tobacco: Never Used  . Alcohol Use: No   OB History   Grav Para Term Preterm Abortions TAB SAB Ect Mult Living                 Review of Systems  Constitutional: Positive for activity change, appetite change and fatigue. Negative for  fever.  HENT: Positive for neck pain. Negative for neck stiffness.   Eyes: Negative for pain.  Respiratory: Negative for chest tightness and shortness of breath.   Cardiovascular: Negative for chest pain and leg swelling.  Gastrointestinal: Negative for nausea, vomiting, abdominal pain and diarrhea.  Genitourinary: Negative for flank pain.  Musculoskeletal: Positive for back pain and gait problem. Negative for joint swelling.  Skin: Negative for rash.  Neurological: Negative for weakness, numbness and headaches.  Psychiatric/Behavioral: Negative for behavioral problems.    Allergies  Review of patient's allergies indicates no known allergies.  Home Medications   No current outpatient prescriptions on file. BP 162/64  Pulse 83  Temp(Src) 98.7 F (37.1 C) (Oral)  Resp 18  SpO2 92% Physical Exam  Constitutional: She is oriented to person, place, and time. She appears well-developed.  HENT:  Head: Normocephalic and atraumatic.  Eyes: Pupils are equal, round, and reactive to light.  Cardiovascular: Normal rate.   Pulmonary/Chest: Effort normal and breath sounds normal.  Abdominal: Soft.  Musculoskeletal: She exhibits tenderness.  Mild tenderness of the entire cervical thoracic and lumbar spine without step-off deformity.  Neurological: She is alert and oriented to person, place, and time.  Skin: Skin is warm.    ED Course  Procedures (including critical care time) Labs Review Labs Reviewed  URINALYSIS, ROUTINE W REFLEX MICROSCOPIC - Abnormal; Notable for the following:    Glucose,  UA 250 (*)    Ketones, ur 15 (*)    Protein, ur 30 (*)    All other components within normal limits  BASIC METABOLIC PANEL - Abnormal; Notable for the following:    Sodium 134 (*)    Potassium 3.2 (*)    Chloride 93 (*)    CO2 33 (*)    Glucose, Bld 197 (*)    GFR calc non Af Amer 88 (*)    All other components within normal limits  GLUCOSE, CAPILLARY - Abnormal; Notable for the following:     Glucose-Capillary 252 (*)    All other components within normal limits  CBC WITH DIFFERENTIAL  URINE MICROSCOPIC-ADD ON  BASIC METABOLIC PANEL  CBC  HEMOGLOBIN A1C   Imaging Review Dg Chest 2 View  11/12/2012   *RADIOLOGY REPORT*  Clinical Data: Weakness, left rib pain  CHEST - 2 VIEW  Comparison: 11/05/2012 thoracic spine exam  Findings: Cardiomegaly evident with diffuse chronic interstitial changes.  No definite edema.  Mitral valve annular calcifications noted.  No CHF or focal pneumonia.  Minimal left base atelectasis with an elevated left hemidiaphragm.  No effusion or pneumothorax. Bones are osteopenic.  Lower thoracic compression fracture with increased kyphosis noted on the lateral view.  Previous lumbar vertebral augmentation changes.  IMPRESSION: Cardiomegaly without CHF  Chronic interstitial changes and left base atelectasis.   Original Report Authenticated By: Judie Petit. Miles Costain, M.D.    MDM   1. Back pain   2. Thoracic compression fracture, closed, initial encounter   3. Dehydration   4. Diabetes mellitus   5. Essential hypertension, benign   6. Hypokalemia   7. Recurrent falls    Patient with back pain. Has thoracic compression fractures of unknown age. Also some dehydration. She's been poorly able to manage at home. She does have some mild "abnormalities. Her pain is poorly controlled with oral medications at home. She'll be admitted as an observation for further treatment.    Juliet Rude. Rubin Payor, MD 11/13/12 [phone]

## 2012-11-12 NOTE — ED Notes (Signed)
Patient transported to X-ray 

## 2012-11-12 NOTE — ED Notes (Addendum)
Per EMS, Pt, from home, c/o back pain x 1 week.  Pain score 4/10.  Pt was seen at [facility] for same complaint after a fall x 1 week ago.  Pt was being evaluated for home health care when EMS arrived.  EMS sts they were told by the Home Health worker that Pt could not get out of bed, due to pain.  EMS sts Pt ambulated without difficulty w/ 1 person assist to stretcher.  EMS sts family wanted Pt brought into ED, because they think that she will be sent to a rehab facility.  Family was upset that Pt was discharged home on previous visit.  Pt sts she has not been taking prescribed pain medication, because it makes her nauseated.  Vitals are stable.  NAD noted.

## 2012-11-12 NOTE — H&P (Signed)
[facility] History and Physical  Janet Frazier BJY:[id number] DOB: 04/01/1930 DOA: 11/12/2012  Referring physician: Dr. Rubin Payor PCP: Thayer Headings, MD  Specialists: none  Chief Complaint: back pain  HPI: Janet Frazier is a 79 y.o. female has a past medical history significant for chronic severe back pain, deconditioning, IDDM, HTN presents to the ED with a chief complaint of worsening back pain over the past week. She has a history of chronic back pain with numerous age indeterminate thoracic spine compression fractures. Her back pain is usually controlled by tramadol, however in the last week has found that not working anymore. She has also become progressively more deconditioned, and family in the room endorses 4 recent ground-level falls, most recent one last week. She was in the ED at that time. Patient and family state that she has been having poor by mouth intake in the past week, do to a combination of worsening pain and decreased ambulatory status. They have a hard time caring for the patient at home, spends a great portion of the day alone. In the emergency room, blood work shows minimal evidence of dehydration. There was a reported oxygen saturation of 89% on room air by EDP, patient asymptomatic. When I interviewed the patient in the ED, her blood pressure was 191/66. Patient denies any chest pain, breathing difficulties, denies any fever or chills. She is now gone no pain, nausea or vomiting. She denies any burning with urination or dysuria.   Family is having more difficulties in caring for the patient at home. ED case manager had a detailed discussion with patient as family.   Review of Systems:  as per history of present illness, otherwise negative   Past Medical History  Diagnosis Date  . Osteoporosis   . Diabetes mellitus without complication   . GERD (gastroesophageal reflux disease)   . Back pain    Past Surgical History  Procedure Laterality Date  . Knee  surgery Right   . Elbow surgery Left   . Eye surgery    . Abdominal surgery     Social History:  reports that she has quit smoking. She has never used smokeless tobacco. She reports that she does not drink alcohol or use illicit drugs.  No Known Allergies  History reviewed. No pertinent family history.   Prior to Admission medications   Medication Sig Start Date End Date Taking? Authorizing Provider  amLODipine (NORVASC) 5 MG tablet Take 5 mg by mouth daily.   Yes Historical Provider, MD  aspirin EC 81 MG tablet Take 81 mg by mouth every other day.    Yes Historical Provider, MD  atenolol (TENORMIN) 25 MG tablet Take 25 mg by mouth daily.   Yes Historical Provider, MD  calcium carbonate (OS-CAL) 600 MG TABS tablet Take 600 mg by mouth daily with breakfast.   Yes Historical Provider, MD  Cholecalciferol (VITAMIN D3) 2000 UNITS TABS Take 1 tablet by mouth daily.   Yes Historical Provider, MD  cyclobenzaprine (FLEXERIL) 10 MG tablet Take 5 mg by mouth 3 (three) times daily as needed for muscle spasms (pai).    Yes Historical Provider, MD  denosumab (PROLIA) 60 MG/ML SOLN injection Inject 60 mg into the skin every 6 (six) months. Administer in upper arm, thigh, or abdomen   Yes Historical Provider, MD  ferrous fumarate (HEMOCYTE - 106 MG FE) 325 (106 FE) MG TABS Take 1 tablet by mouth every other day.   Yes Historical Provider, MD  furosemide (LASIX) 20 MG tablet  Take 20 mg by mouth every other day.   Yes Historical Provider, MD  HYDROcodone-acetaminophen (NORCO/VICODIN) 5-325 MG per tablet Take 2 tablets by mouth every 4 (four) hours as needed for pain. 11/05/12  Yes Toy Baker, MD  ibuprofen (ADVIL,MOTRIN) 200 MG tablet Take 400 mg by mouth every 6 (six) hours as needed for pain.    Yes Historical Provider, MD  insulin aspart (NOVOLOG) 100 UNIT/ML injection Inject 4-6 Units into the skin 3 (three) times daily with meals. 5units in am, 4units at lunch, and 6units in pm   Yes Historical  Provider, MD  insulin glargine (LANTUS) 100 UNIT/ML injection Inject 20 Units into the skin daily. Takes in the am   Yes Historical Provider, MD  lansoprazole (PREVACID) 30 MG capsule Take 30 mg by mouth daily.   Yes Historical Provider, MD  neomycin-polymyxin-pramoxine (NEOSPORIN PLUS) 1 % cream Apply 1 application topically 2 (two) times daily as needed (applied to wounds).    Yes Historical Provider, MD  prochlorperazine (COMPAZINE) 5 MG tablet Take 5 mg by mouth every 6 (six) hours as needed for nausea.    Yes Historical Provider, MD  traMADol (ULTRAM) 50 MG tablet Take 50 mg by mouth every 6 (six) hours as needed for pain.    Yes Historical Provider, MD   Physical Exam: Filed Vitals:   11/12/12 1511 11/12/12 2013 11/12/12 2100  BP: 163/85 191/66 162/64  Pulse: 69 62 83  Temp: 98.7 F (37.1 C)    TempSrc: Oral  Oral  Resp: 18 20 18   SpO2: 92% 90% 92%     General:  No apparent distress, frail-looking Caucasian female   Eyes: PERRL, EOMI, no scleral icterus  ENT: moist oropharynx  Neck: supple, no JVD  Cardiovascular: regular rate without MRG; 2+ peripheral pulses  Respiratory: CTA biL,   Abdomen: soft, non tender to palpation, positive bowel sounds, no guarding, no rebound  Skin: no rashes  Musculoskeletal: no peripheral edema  Psychiatric: normal mood and affect  Neurologic: Nonfocal  Labs on Admission:  Basic Metabolic Panel:  Recent Labs Lab 11/12/12 1835  NA 134*  K 3.2*  CL 93*  CO2 33*  GLUCOSE 197*  BUN 20  CREATININE 0.52  CALCIUM 8.6   CBC:  Recent Labs Lab 11/12/12 1835  WBC 10.2  NEUTROABS 7.3  HGB 13.8  HCT 40.0  MCV 84.9  PLT 258   Radiological Exams on Admission: Dg Chest 2 View  11/12/2012   *RADIOLOGY REPORT*  Clinical Data: Weakness, left rib pain  CHEST - 2 VIEW  Comparison: 11/05/2012 thoracic spine exam  Findings: Cardiomegaly evident with diffuse chronic interstitial changes.  No definite edema.  Mitral valve annular  calcifications noted.  No CHF or focal pneumonia.  Minimal left base atelectasis with an elevated left hemidiaphragm.  No effusion or pneumothorax. Bones are osteopenic.  Lower thoracic compression fracture with increased kyphosis noted on the lateral view.  Previous lumbar vertebral augmentation changes.  IMPRESSION: Cardiomegaly without CHF  Chronic interstitial changes and left base atelectasis.   Original Report Authenticated By: Judie Petit. Miles Costain, M.D.   DG Thoracic (11/05/12) Marked osteopenia with numerous age indeterminate thoracic spine  compression fractures.  DG Lumbar (11/05/12) Marked osteopenia with stable appearance of the lumbar spine.   EKG: Independently reviewed.  Assessment/Plan Active Problems:   Back pain   Hypokalemia   Recurrent falls   Essential hypertension, benign   Diabetes mellitus    Back pain - pain getting worse after recent falls.  She had lumbar and thoracic XR last week (see above for impression) without major acute findings. Oxycodone for pain.  HTN - hypertensive to 190s systolic in the ED. Pain control and restart home medications. Monitor. Recurrent falls - PT to evaluate. Consult CM on the floor as well.  Hypokalemia  - possibly due to poor po intake. Replete and monitor.  IDDM - restart insulin at a lower dose given poor po intake.  DVT prophylaxis - Lovenox   Code Status: DO NOT RESUSCITATE   Family Communication: Daughters at bedside   Disposition Plan: Observation   Time spent: 50   Shawntee Mainwaring M. Elvera Lennox, MD [facility] Pager [phone]  If 7PM-7AM, please contact night-coverage [email or web address] Password [email or web address] 11/12/2012, 10:07 PM

## 2012-11-12 NOTE — ED Notes (Signed)
Attempted to call report. RN in pts room, will return call to ED

## 2012-11-12 NOTE — ED Notes (Signed)
Pt requesting more pain medication. Pt states she would like what she had before.

## 2012-11-12 NOTE — ED Notes (Signed)
Nita Sells RN and this RN unsuccessfully attempted IV starts x 3.  Asked Consulting civil engineer to attempt.

## 2012-11-12 NOTE — ED Notes (Signed)
MD aware of 02 sat

## 2012-11-12 NOTE — ED Notes (Signed)
MD at bedside. 

## 2012-11-12 NOTE — Progress Notes (Signed)
EDCM spoke to patient and two daughters at bedside.  As per patient's daughter Jeanella Flattery, RN from [facility] came out to see patient today and spoke to patient's pcp.  As per Jeanella Flattery, patient's pcp stated that patient needed to come to the ED and be admitted for three days so that she can go to a rehab.  Marybeth explained to [facility] that they have hired someone to stay with patient from 8am to 12 noon and then from 5-8pm, patient is alone at night.  EDCM questioned whether someone in the family can stay with her at night.  Patient's other daughter immediately became upset and stated,"Don't ask [place] if there is anything else we can do because we are going to do what we have to do for our mother.  The question is, what are you going to do.  Our mother lives alone, needs to be in a rehab where she can be safe and recieve therapy and have her blood sugar taken by a professional!!! [facility] apologized immediately.  EDCM explained to patient's daughter she was asking questions about her situation at home to see if she would qualify for home health.  EDCM did not mean that they (the family) was not taking care of their mother.  EDCM went on to explain that if after all of the testing is done and there is nothing medically to keep patient in the hospital, then patient will be discharged home.  EDCM explained that in order for Medicare to approve Rehab or SNF, the patient would require a three day inpatient stay.  EDCM explained that even if she was admitted as an observation, Medicare would not approve SNF or rehab.  EDCM expalined to patient and patient's family that with home health, patient would receive an Charity fundraiser, PT, OT, aide and Child psychotherapist.  The social worker can help facilitate the transition to a Rehab or SNF from home.  Patient's daughter stated, "My mother needs to be in a rehab, not in one week, in 24 hours!!  So your little social worker thing better make it happen wihin 24 hours."  EDCM explained to patient's family that  it will not happen within 24 hours.  Once a home health agency is consulted, they have 24-48 hours to get in touch with the patient.  Lab tech came in to draw blood at this time.  Awaiting blood work and test results.  Patient's familky refused list of home health agencies.  If patient is discharged they choose [facility] because they have already been out to the patient's house today.

## 2012-11-13 LAB — GLUCOSE, CAPILLARY: Glucose-Capillary: 159 mg/dL — ABNORMAL HIGH (ref 70–99)

## 2012-11-13 LAB — HEMOGLOBIN A1C
Hgb A1c MFr Bld: 9.1 % — ABNORMAL HIGH (ref <5.7)
Mean Plasma Glucose: 214 mg/dL — ABNORMAL HIGH (ref <117)

## 2012-11-13 LAB — CBC
Hemoglobin: 12.4 g/dL (ref 12.0–15.0)
MCH: 28.2 pg (ref 26.0–34.0)
RBC: 4.39 MIL/uL (ref 3.87–5.11)

## 2012-11-13 LAB — BASIC METABOLIC PANEL
CO2: 31 mEq/L (ref 19–32)
Chloride: 98 mEq/L (ref 96–112)
GFR calc Af Amer: >90 mL/min (ref >90)
GFR calc non Af Amer: >90 mL/min (ref >90)

## 2012-11-13 MED ORDER — PANTOPRAZOLE SODIUM 40 MG PO TBEC
40.0000 mg | DELAYED_RELEASE_TABLET | Freq: Every day | ORAL | Status: DC
  Administered 2012-11-13 – 2012-11-14 (×2): 40 mg via ORAL
  Filled 2012-11-13: qty 1

## 2012-11-13 MED ORDER — ENSURE COMPLETE PO LIQD
237.0000 mL | Freq: Two times a day (BID) | ORAL | Status: DC
  Administered 2012-11-14: 237 mL via ORAL

## 2012-11-13 MED ORDER — NAPROXEN 375 MG PO TABS
375.0000 mg | ORAL_TABLET | Freq: Two times a day (BID) | ORAL | Status: DC
  Administered 2012-11-13 – 2012-11-14 (×4): 375 mg via ORAL
  Filled 2012-11-13 (×6): qty 1

## 2012-11-13 MED ORDER — AMLODIPINE BESYLATE 10 MG PO TABS
10.0000 mg | ORAL_TABLET | Freq: Every day | ORAL | Status: DC
  Administered 2012-11-13 – 2012-11-14 (×2): 10 mg via ORAL
  Filled 2012-11-13 (×2): qty 1

## 2012-11-13 MED ORDER — HYDROMORPHONE HCL PF 1 MG/ML IJ SOLN
0.5000 mg | INTRAMUSCULAR | Status: DC | PRN
  Administered 2012-11-14: 0.5 mg via INTRAVENOUS
  Filled 2012-11-13: qty 1

## 2012-11-13 NOTE — Care Management Note (Signed)
Patient was referred to [facility] for Home health prior to most recent hospitalization by Dr. Shary Decamp. Start of care was initiated on 11/12/12, during which the recommendation was made to send patient to ED due to severe pain.  HH orders for RN/PT/OT.

## 2012-11-13 NOTE — Progress Notes (Addendum)
Clinical Social Work Department CLINICAL SOCIAL WORK PLACEMENT NOTE 11/13/2012  Patient:  Janet Frazier, Janet Frazier  Account Number:  [id number] Admit date:  11/12/2012  Clinical Social Worker:  Unk Lightning, LCSW  Date/time:  11/13/2012 03:15 PM  Clinical Social Work is seeking post-discharge placement for this patient at the following level of care:   SKILLED NURSING   (*CSW will update this form in Epic as items are completed)   11/13/2012  Patient/family provided with [facility] Department of Clinical Social Work's list of facilities offering this level of care within the geographic area requested by the patient (or if unable, by the patient's family).  11/13/2012  Patient/family informed of their freedom to choose among providers that offer the needed level of care, that participate in Medicare, Medicaid or managed care program needed by the patient, have an available bed and are willing to accept the patient.  11/13/2012  Patient/family informed of [facility]' ownership interest in [facility], as well as of the fact that they are under no obligation to receive care at this facility.  PASARR submitted to EDS on 11/13/2012 PASARR number received from EDS on 11/13/2012  FL2 transmitted to all facilities in geographic area requested by pt/family on  11/13/2012 FL2 transmitted to all facilities within larger geographic area on   Patient informed that his/her managed care company has contracts with or will negotiate with  certain facilities, including the following:     Patient/family informed of bed offers received:   Patient chooses bed at  Physician recommends and patient chooses bed at    Patient to be transferred to  on   Patient to be transferred to facility by   The following physician request were entered in Epic:   Additional Comments:  11/14/12-Family and patient have decided to return home with [facility] services.

## 2012-11-13 NOTE — Progress Notes (Addendum)
[facility] PROGRESS NOTE  Janet Frazier ZOX:[id number] DOB: [birth year] DOA: 11/12/2012 PCP: Thayer Headings, MD  Assessment/Plan: 1. Acute on chronic back pain -multiple Compression fractures are identified in the  thoracic spine, involving the T4, T6, T9, T11, and T12 levels -age indeterminate, likely some are recent -add low dose dilaudid, Po oxycodone PRN, NSAIDs -Physical therapy/OT  eval pending  2. Dehydration -poor Po intake due to pain, deconditioning  3. HTN: uncontrolled -increase amlodipine, atenolol  4. DM: -continue lantus, SSI  5. GERD -continue PPI  6. Multiple frequent falls -needs PT eval  DVT proph: lovenox   Code Status: DNR Family Communication: none at bedside Disposition Plan: to be determined     HPI/Subjective: Still with moderately severe pain with any movement, unable to move  Objective: Filed Vitals:   11/13/12 0644  BP: 150/54  Pulse:   Temp:   Resp:     Intake/Output Summary (Last 24 hours) at 11/13/12 0925 Last data filed at 11/13/12 0236  Gross per 24 hour  Intake      0 ml  Output      2 ml  Net     -2 ml   There were no vitals filed for this visit.  Exam:   General: AAOx3, no distress  Cardiovascular:S1S2/RRR  Respiratory: basilar ronchi  Abdomen: soft, NT, BS present  Musculoskeletal: no edema c/c  Data Reviewed: Basic Metabolic Panel:  Recent Labs Lab 11/12/12 1835 11/13/12 0440  NA 134* 134*  K 3.2* 4.0  CL 93* 98  CO2 33* 31  GLUCOSE 197* 180*  BUN 20 13  CREATININE 0.52 0.41*  CALCIUM 8.6 7.9*   Liver Function Tests: No results found for this basename: AST, ALT, ALKPHOS, BILITOT, PROT, ALBUMIN,  in the last 168 hours No results found for this basename: LIPASE, AMYLASE,  in the last 168 hours No results found for this basename: AMMONIA,  in the last 168 hours CBC:  Recent Labs Lab 11/12/12 1835 11/13/12 0440  WBC 10.2 8.0  NEUTROABS 7.3  --   HGB 13.8 12.4  HCT 40.0 37.9   MCV 84.9 86.3  PLT 258 237   Cardiac Enzymes: No results found for this basename: CKTOTAL, CKMB, CKMBINDEX, TROPONINI,  in the last 168 hours BNP (last 3 results) No results found for this basename: PROBNP,  in the last 8760 hours CBG:  Recent Labs Lab 11/12/12 2209 11/13/12 0803  GLUCAP 252* 168*    No results found for this or any previous visit (from the past 240 hour(s)).   Studies: Dg Chest 2 View  11/12/2012   *RADIOLOGY REPORT*  Clinical Data: Weakness, left rib pain  CHEST - 2 VIEW  Comparison: 11/05/2012 thoracic spine exam  Findings: Cardiomegaly evident with diffuse chronic interstitial changes.  No definite edema.  Mitral valve annular calcifications noted.  No CHF or focal pneumonia.  Minimal left base atelectasis with an elevated left hemidiaphragm.  No effusion or pneumothorax. Bones are osteopenic.  Lower thoracic compression fracture with increased kyphosis noted on the lateral view.  Previous lumbar vertebral augmentation changes.  IMPRESSION: Cardiomegaly without CHF  Chronic interstitial changes and left base atelectasis.   Original Report Authenticated By: Judie Petit. Shick, M.D.    Scheduled Meds: . amLODipine  5 mg Oral Daily  . aspirin EC  81 mg Oral QODAY  . atenolol  25 mg Oral Daily  . enoxaparin (LOVENOX) injection  40 mg Subcutaneous Q24H  . insulin aspart  0-9 Units Subcutaneous TID WC  .  insulin glargine  10 Units Subcutaneous Daily  . pantoprazole  20 mg Oral Daily   Continuous Infusions: . sodium chloride 75 mL/hr at 11/13/12 0442    Active Problems:   Back pain   Hypokalemia   Recurrent falls   Essential hypertension, benign   Diabetes mellitus    Time spent:    [facility]  [facility] Pager [phone]. If 7PM-7AM, please contact night-coverage at [email or web address], password [email or web address] 11/13/2012, 9:25 AM  LOS: 1 day

## 2012-11-13 NOTE — Progress Notes (Signed)
CARE MANAGEMENT NOTE 11/13/2012  Patient:  ZEENA, STARKEL   Account Number:  [id number]  Date Initiated:  11/13/2012  Documentation initiated by:  [facility]  Subjective/Objective Assessment:   79 year old female admitted with back pain after falls at home.     Action/Plan:   From home with [facility] services.   Anticipated DC Date:  11/14/2012   Anticipated DC Plan:  HOME W HOME HEALTH SERVICES   DC Planning Services  CM consult      Choice offered to / List presented to:       Status of service:  In process, will continue to follow Medicare Important Message given?  NA - LOS <3 / Initial given by admissions (If response is "NO", the following Medicare IM given date fields will be blank)    Per UR Regulation:  Reviewed for med. necessity/level of care/duration of stay    Comments:  11/13/12 Kaylanni Ezelle RN BSN CSW and I met with pt at bedside to inform her that she was only qualifying for OBS status and that if she went to [facility] for rehab Medicare would not cover the cost. Pt expressed understanding. She agreed to home health services through [facility] once she is discharged. She asked [place] to call her daughter Eber Jones and let her know of this. I have left a VM for daughter.

## 2012-11-13 NOTE — Progress Notes (Signed)
INITIAL NUTRITION ASSESSMENT  DOCUMENTATION CODES Per approved criteria  -Not Applicable   INTERVENTION: - Ensure Complete BID - Encouraged continued excellent meal intake  - Recommend nursing get updated weight for this admission - Will continue to monitor   NUTRITION DIAGNOSIS: Increased nutrient needs related to multiple compression fractures as evidenced by MD notes.   Goal: Pt to consume >90% of meals/supplements   Monitor:  Weights, labs, intake  Reason for Assessment: Nutrition risk   79 y.o. female  Admitting Dx: Back pain  ASSESSMENT: Pt with past medical history significant for chronic severe back pain, deconditioning, IDDM, HTN presents to the ED with a chief complaint of worsening back pain over the past week. Pt with low blood sugars PTA.   Met with who reports poor appetite for the past month however states she has been trying to eat 3 meals/day and snacks. Pt admits that her intake gradually got less and less over the past month. Pt reports improved appetite since admission and observed empty lunch tray - pt states she ate 100% of burger. Pt states she has been told that she needs to gain 20 pounds. No weight yet documented for this admission however pt reports weighing 117 pounds recently. Unsure of how much weight she has lost recently, however weight trend shows pt's weight down 5 pounds in the past 3 months.   Pt with progressive deconditioning with recurrent falls PTA. Encouraged pt to consume high protein diet for strength building.   Lab Results  Component Value Date   HGBA1C 9.1* 11/12/2012    Height: Ht Readings from Last 1 Encounters:  08/18/12 4\' 9"  (1.448 m)    Weight: Wt Readings from Last 1 Encounters:  08/18/12 122 lb (55.339 kg)    Ideal Body Weight: 95 lb  % Ideal Body Weight: 123%  Wt Readings from Last 10 Encounters:  08/18/12 122 lb (55.339 kg)    Usual Body Weight: 117 lb per pt  % Usual Body Weight: 100%  BMI:  25  kg/(m^2) per pt report of current weight of 117 pounds  Estimated Nutritional Needs: Kcal: 1300-1500 Protein: 55-65g Fluid: 1.3-1.5L/day  Skin: Diabetic ulcer on left leg  Diet Order: Carb Control  EDUCATION NEEDS: -No education needs identified at this time   Intake/Output Summary (Last 24 hours) at 11/13/12 1643 Last data filed at 11/13/12 0236  Gross per 24 hour  Intake      0 ml  Output      2 ml  Net     -2 ml    Last BM: 9/8  Labs:   Recent Labs Lab 11/12/12 1835 11/13/12 0440  NA 134* 134*  K 3.2* 4.0  CL 93* 98  CO2 33* 31  BUN 20 13  CREATININE 0.52 0.41*  CALCIUM 8.6 7.9*  GLUCOSE 197* 180*    CBG (last 3)   Recent Labs  11/12/12 2209 11/13/12 0803  GLUCAP 252* 168*    Scheduled Meds: . amLODipine  10 mg Oral Daily  . aspirin EC  81 mg Oral QODAY  . atenolol  25 mg Oral Daily  . enoxaparin (LOVENOX) injection  40 mg Subcutaneous Q24H  . insulin aspart  0-9 Units Subcutaneous TID WC  . insulin glargine  10 Units Subcutaneous Daily  . naproxen  375 mg Oral BID WC  . pantoprazole  40 mg Oral Daily    Continuous Infusions: . sodium chloride 75 mL/hr at 11/13/12 0442    Past Medical History  Diagnosis  Date  . Osteoporosis   . Diabetes mellitus without complication   . GERD (gastroesophageal reflux disease)   . Back pain     Past Surgical History  Procedure Laterality Date  . Knee surgery Right   . Elbow surgery Left   . Eye surgery    . Abdominal surgery      Levon Hedger MS, RD, LDN [phone] Pager [phone] After Hours Pager

## 2012-11-13 NOTE — Evaluation (Signed)
Physical Therapy Evaluation Patient Details Name: Janet Frazier MRN: [id number] DOB: [birth year] Today's Date: 11/13/2012 Time: 9562-1308 PT Time Calculation (min): 24 min  PT Assessment / Plan / Recommendation History of Present Illness  79 y.o. with h/o severe chronic back pain, IDDM, HTN, t-spine compression fxs admitted with weakness, 4 recent falls, decreased p.o. intake, hypokalemia.  Clinical Impression  *Mod assist for supine to sit and to pivot to recliner. Pt needs 24* assist for ADLs and mobility. Pt agreeable to ST-SNF. She would benefit from acute PT to maximize safety and independence with mobility. **    PT Assessment  Patient needs continued PT services    Follow Up Recommendations  SNF    Does the patient have the potential to tolerate intense rehabilitation      Barriers to Discharge        Equipment Recommendations  None recommended by PT    Recommendations for Other Services     Frequency Min 3X/week    Precautions / Restrictions     Pertinent Vitals/Pain *9/10 back pain premedicated**      Mobility  Bed Mobility Bed Mobility: Left Sidelying to Sit Left Sidelying to Sit: 3: Mod assist Details for Bed Mobility Assistance: assist to rise Transfers Transfers: Sit to Stand;Stand to Sit;Stand Pivot Transfers Sit to Stand: 3: Mod assist;From bed Stand to Sit: 3: Mod assist;To chair/3-in-1 Stand Pivot Transfers: 3: Mod assist Details for Transfer Assistance: spt with RW Ambulation/Gait Ambulation/Gait Assistance: Not tested (comment)    Exercises     PT Diagnosis: Difficulty walking;Generalized weakness;Acute pain  PT Problem List: Decreased activity tolerance;Decreased mobility;Pain PT Treatment Interventions:       PT Goals(Current goals can be found in the care plan section) Acute Rehab PT Goals Patient Stated Goal: decrease pain, be more mobile PT Goal Formulation: With patient Time For Goal Achievement: 11/27/12 Potential to Achieve  Goals: Fair  Visit Information  Last PT Received On: 11/13/12 Assistance Needed: +1 History of Present Illness: 79 y.o. with h/o severe chronic back pain, IDDM, HTN, t-spine compression fxs admitted with weakness, 4 recent falls, decreased p.o. intake, hypokalemia.       Prior Functioning  Home Living Family/patient expects to be discharged to:: Private residence Living Arrangements: Alone Type of Home: Apartment Home Access: Stairs to enter [employer] of Steps: 2 Entrance Stairs-Rails: Right Home Layout: One level Home Equipment: Walker - 4 wheels Prior Function Comments: sponge bathed, dressed independently, had HH aide for past week who came daily to assist with cooking and walking Communication Communication: No difficulties    Cognition  Cognition Arousal/Alertness: Awake/alert Behavior During Therapy: WFL for tasks assessed/performed Overall Cognitive Status: Within Functional Limits for tasks assessed    Extremity/Trunk Assessment Upper Extremity Assessment Upper Extremity Assessment: Overall WFL for tasks assessed Lower Extremity Assessment Lower Extremity Assessment: Overall WFL for tasks assessed Cervical / Trunk Assessment Cervical / Trunk Assessment: Kyphotic   Balance Balance Balance Assessed: Yes Static Sitting Balance Static Sitting - Balance Support: Bilateral upper extremity supported;Feet supported Static Sitting - Level of Assistance: 5: Stand by assistance Static Sitting - Comment/# of Minutes: 5  End of Session PT - End of Session Equipment Utilized During Treatment: Gait belt Activity Tolerance: Patient limited by fatigue;Patient limited by pain Patient left: in chair;with call bell/phone within reach Nurse Communication: Mobility status  GP Functional Assessment Tool Used: clinical judgement Functional Limitation: Mobility: Walking and moving around Mobility: Walking and Moving Around Current Status ([id number]): At least 60 percent  but  less than 80 percent impaired, limited or restricted Mobility: Walking and Moving Around Goal Status [phone]): At least 20 percent but less than 40 percent impaired, limited or restricted   Tamala Ser 11/13/2012, 12:50 PM [phone]

## 2012-11-13 NOTE — Progress Notes (Signed)
Clinical Social Work Department BRIEF PSYCHOSOCIAL ASSESSMENT 11/13/2012  Patient:  Janet Frazier, Janet Frazier     Account Number:  [id number]     Admit date:  11/12/2012  Clinical Social Worker:  Dennison Bulla  Date/Time:  11/13/2012 03:15 PM  Referred by:  Physician  Date Referred:  11/13/2012 Referred for  SNF Placement   Other Referral:   Interview type:  Patient Other interview type:    PSYCHOSOCIAL DATA Living Status:  FAMILY Admitted from facility:   Level of care:   Primary support name:  Eber Jones Primary support relationship to patient:  CHILD, ADULT Degree of support available:   Strong    CURRENT CONCERNS Current Concerns  Post-Acute Placement   Other Concerns:    SOCIAL WORK ASSESSMENT / PLAN CSW received referral due to patient and family being interested in SNF placement. CSW spoke with CM who reports that patient is only meeting observation status and will not have a 3 day qualifying stay for SNF placement.    CSW and CM met with patient at bedside in order to discuss DC plans. Patient currently staying at home with [facility]. CSW and CM explained that since she did not have a qualifying stay that Medicare would not cover SNF placement. Patient understanding of information and asked CSW and CM to contact dtrs regarding this information.    CSW spoke with Eber Jones via phone. CSW explained information to dtr and offered to complete SNF search if family desired but that patient would have to pay privately. Dtr interested in SNF search and understanding of private pay. CSW left SNF list in room for family to review. CSW will follow up with bed offers.    CSW completed FL2 and faxed out. Patient and dtr have CSW contact information if further questions arise.   Assessment/plan status:  Psychosocial Support/Ongoing Assessment of Needs Other assessment/ plan:   Information/referral to community resources:   SNF information    PATIENT'S/FAMILY'S RESPONSE TO PLAN OF  CARE: Patient alert and engaged in assessment. Patient and dtr voiced understanding of SNF placement and needing to pay privately. Dtr reports she will talk with CM regarding specific questions regarding observation status. Dtr reports frustration because patient was placed in [place] and dtr feels that this hospital is not allowing patient to be placed. CSW explained that hospital is following Medicare guidelines. Dtr agreeable for CSW to follow up with bed offers.

## 2012-11-14 LAB — GLUCOSE, CAPILLARY
Glucose-Capillary: 130 mg/dL — ABNORMAL HIGH (ref 70–99)
Glucose-Capillary: 284 mg/dL — ABNORMAL HIGH (ref 70–99)
Glucose-Capillary: 227 mg/dL — ABNORMAL HIGH (ref 70–99)

## 2012-11-14 MED ORDER — INSULIN GLARGINE 100 UNIT/ML ~~LOC~~ SOLN: 15.0000 [IU] | Freq: Every day | SUBCUTANEOUS | Status: DC

## 2012-11-14 MED ORDER — AMLODIPINE BESYLATE 5 MG PO TABS: 10.0000 mg | ORAL_TABLET | Freq: Every day | ORAL | Status: DC

## 2012-11-14 MED ORDER — IBUPROFEN 200 MG PO TABS: 400.0000 mg | ORAL_TABLET | Freq: Three times a day (TID) | ORAL | Status: DC | PRN

## 2012-11-14 MED ORDER — HYDROCHLOROTHIAZIDE 25 MG PO TABS
25.0000 mg | ORAL_TABLET | Freq: Every day | ORAL | Status: DC
  Administered 2012-11-14: 25 mg via ORAL
  Filled 2012-11-14: qty 1

## 2012-11-14 MED ORDER — TRAMADOL HCL 50 MG PO TABS: 50.0000 mg | ORAL_TABLET | Freq: Four times a day (QID) | ORAL | Status: DC | PRN

## 2012-11-14 MED ORDER — OXYCODONE HCL 5 MG PO TABS: 5.0000 mg | ORAL_TABLET | Freq: Four times a day (QID) | ORAL | Status: DC | PRN

## 2012-11-14 MED ORDER — GLUCERNA SHAKE PO LIQD
237.0000 mL | Freq: Three times a day (TID) | ORAL | Status: DC
  Administered 2012-11-14: 237 mL via ORAL
  Filled 2012-11-14 (×2): qty 237

## 2012-11-14 NOTE — Progress Notes (Signed)
Spoke with pt and 2 daughters Jeanella Flattery and Eber Jones about d/c planning. They wish to continue with [facility] as the [facility] services provider. She will need HHRN/PT/OT aide and Child psychotherapist. They also asked for a copy of private duty sitter list in case they need those services.  Algernon Huxley RN BSN   [phone]

## 2012-11-14 NOTE — Progress Notes (Signed)
Pt complained of pain from her IV site at the beginning of the shift. IV site accessed, site red and swollen (Left arm). Pt had Normal Saline at 12ml/hr running. Fluids stopped and  IV discontinued. Pt's arm elevated and Ice applied to site. IV team paged for assessment and new IV site.

## 2012-11-14 NOTE — Discharge Summary (Signed)
Physician Discharge Summary  Janet Frazier YNW:[id number] DOB: Feb 04, 1931 DOA: 11/12/2012  PCP: Thayer Headings, MD  Admit date: 11/12/2012 Discharge date: 11/14/2012  Time spent: 45 minutes  Recommendations for Outpatient Follow-up:  1. PCP in 1 week  Discharge Diagnoses:   Acute on chronic Back pain  Multiple Thoracic vertebral compression fractures   Hypokalemia   Recurrent falls   Essential hypertension, benign   Diabetes mellitus   Discharge Condition: stable  Diet recommendation: low sodium  Filed Weights   11/14/12 1100  Weight: 49.986 kg (110 lb 3.2 oz)    History of present illness:  Janet Frazier is a 79 y.o. female has a past medical history significant for chronic severe back pain, deconditioning, IDDM, HTN presents to the ED with a chief complaint of worsening back pain over the past week. She has a history of chronic back pain with numerous age indeterminate thoracic spine compression fractures. Her back pain is usually controlled by tramadol, however in the last week has found that not working anymore. She has also become progressively more deconditioned, and family in the room endorses 4 recent ground-level falls, most recent one last week. She was in the ED at that time. Patient and family state that she has been having poor by mouth intake in the past week, do to a combination of worsening pain and decreased ambulatory status. They have a hard time caring for the patient at home, spends a great portion of the day alone.  Hospital Course:  1. Acute on chronic back pain -multiple Compression fractures are identified in the  thoracic spine, involving the T4, T6, T9, T11, and T12 levels noted on Xray Thoraco-lumbar spine -age indeterminate, likely some are recent  -treated with Po oxycodone PRN, NSAIDs for 4-5days -Physical therapy/OT eval completed, SNF recommended but unfortunately was not meeting criteria for inpatient per Utilization review and hence medicare  would not cover the cost of placement at Rehab. -Private pay SNF option was offered and her information was faxed out to facilities but was unaffordable to pt and family and hence they elected to take her home with more family support and home health services, namely PT/OT, RN, Aide and Social worker   2. Dehydration  -poor Po intake due to pain, deconditioning  -hydrated with IVF  3. HTN: uncontrolled  -improved -increased amlodipine, continued on atenolol   4. DM:  -continue lantus, SSI   5. GERD  -continue PPI     Consultations:  Physical  Occupational Therapy  Discharge Exam: Filed Vitals:   11/14/12 1402  BP: 129/68  Pulse: 60  Temp: 98.2 F (36.8 C)  Resp: 20    General: AAox3 Cardiovascular: S1s2/RRR Respiratory: CTAB  Discharge Instructions  Discharge Orders   Future Appointments Provider Department Dept Phone   12/01/2012 8:45 AM Sherrie George, MD [facility] [phone]   Future Orders Complete By Expires   Diet - low sodium heart healthy  As directed    Increase activity slowly  As directed        Medication List         amLODipine 5 MG tablet  Commonly known as:  NORVASC  Take 2 tablets (10 mg total) by mouth daily.     aspirin EC 81 MG tablet  Take 81 mg by mouth every other day.     atenolol 25 MG tablet  Commonly known as:  TENORMIN  Take 25 mg by mouth daily.     calcium  carbonate 600 MG Tabs tablet  Commonly known as:  OS-CAL  Take 600 mg by mouth daily with breakfast.     cyclobenzaprine 10 MG tablet  Commonly known as:  FLEXERIL  Take 5 mg by mouth 3 (three) times daily as needed for muscle spasms (pai).     denosumab 60 MG/ML Soln injection  Commonly known as:  PROLIA  Inject 60 mg into the skin every 6 (six) months. Administer in upper arm, thigh, or abdomen     ferrous fumarate 325 (106 FE) MG Tabs tablet  Commonly known as:  HEMOCYTE - 106 mg FE  Take 1 tablet by mouth every other day.      furosemide 20 MG tablet  Commonly known as:  LASIX  Take 20 mg by mouth every other day.     HYDROcodone-acetaminophen 5-325 MG per tablet  Commonly known as:  NORCO/VICODIN  Take 2 tablets by mouth every 4 (four) hours as needed for pain.     ibuprofen 200 MG tablet  Commonly known as:  ADVIL,MOTRIN  Take 2 tablets (400 mg total) by mouth every 8 (eight) hours as needed for pain (with meals preferably if possible). For 1 week     insulin aspart 100 UNIT/ML injection  Commonly known as:  novoLOG  Inject 4-6 Units into the skin 3 (three) times daily with meals. 5units in am, 4units at lunch, and 6units in pm     insulin glargine 100 UNIT/ML injection  Commonly known as:  LANTUS  Inject 0.15 mLs (15 Units total) into the skin daily. Takes in the am     lansoprazole 30 MG capsule  Commonly known as:  PREVACID  Take 30 mg by mouth daily.     neomycin-polymyxin-pramoxine 1 % cream  Commonly known as:  NEOSPORIN PLUS  Apply 1 application topically 2 (two) times daily as needed (applied to wounds).     oxyCODONE 5 MG immediate release tablet  Commonly known as:  Oxy IR/ROXICODONE  Take 1 tablet (5 mg total) by mouth every 6 (six) hours as needed (for severe pain).     prochlorperazine 5 MG tablet  Commonly known as:  COMPAZINE  Take 5 mg by mouth every 6 (six) hours as needed for nausea.     traMADol 50 MG tablet  Commonly known as:  ULTRAM  Take 1 tablet (50 mg total) by mouth every 6 (six) hours as needed for pain (for mild to moderate pain).     Vitamin D3 2000 UNITS Tabs  Take 1 tablet by mouth daily.       No Known Allergies     Follow-up Information   Follow up with Thayer Headings, MD. Schedule an appointment as soon as possible for a visit in 1 week.   Specialty:  Internal Medicine   Contact information:   [address] [place] [address] [phone]        The results of significant diagnostics from this hospitalization (including  imaging, microbiology, ancillary and laboratory) are listed below for reference.    Significant Diagnostic Studies: Dg Chest 2 View  11/12/2012   *RADIOLOGY REPORT*  Clinical Data: Weakness, left rib pain  CHEST - 2 VIEW  Comparison: 11/05/2012 thoracic spine exam  Findings: Cardiomegaly evident with diffuse chronic interstitial changes.  No definite edema.  Mitral valve annular calcifications noted.  No CHF or focal pneumonia.  Minimal left base atelectasis with an elevated left hemidiaphragm.  No effusion or pneumothorax. Bones are osteopenic.  Lower thoracic compression fracture with increased kyphosis noted on the lateral view.  Previous lumbar vertebral augmentation changes.  IMPRESSION: Cardiomegaly without CHF  Chronic interstitial changes and left base atelectasis.   Original Report Authenticated By: Judie Petit. Miles Costain, M.D.   Dg Thoracic Spine 2 View  11/05/2012   *RADIOLOGY REPORT*  Clinical Data: Fall.  Generalized back pain.  THORACIC SPINE - 2 VIEW  Comparison: None.  Findings: Two-view exam of the thoracic spine shows marked osteopenia.  Numerous compression fractures are identified in the thoracic spine, involving the T4, T6, T9, T11, and T12 levels.  T6 shows the most substantial loss of vertebral body height.  All of these are age indeterminate by x-ray.  IMPRESSION: Marked osteopenia with numerous age indeterminate thoracic spine compression fractures.   Original Report Authenticated By: Kennith Center, M.D.   Dg Lumbar Spine Complete  11/05/2012   *RADIOLOGY REPORT*  Clinical Data: Fall with back pain  LUMBAR SPINE - COMPLETE 4+ VIEW  Comparison: 08/18/2012  Findings: Bones are markedly demineralized.  The patient is status post vertebral augmentation at L3 and L4 with stable imaging features.  Loss of vertebral body heights and there is superior endplate compression fracture at L1 is stable.  SI joints are unremarkable.  IMPRESSION: Marked osteopenia with stable appearance of the lumbar spine.    Original Report Authenticated By: Kennith Center, M.D.   Ct Cervical Spine Wo Contrast  11/05/2012   *RADIOLOGY REPORT*  Clinical Data: Recent trauma with neck pain  CT CERVICAL SPINE WITHOUT CONTRAST  Technique:  Multidetector CT imaging of the cervical spine was performed. Multiplanar CT image reconstructions were also generated.  Comparison: None.  Findings: Seven cervical segments are well visualized.  Vertebral body height is well-maintained.  Mild osteophytic changes are seen. No acute fracture or acute facet abnormality is noted.  The surrounding soft tissues of the neck show no acute abnormality. The visualized lung apices are within normal limits.  IMPRESSION: Degenerative change without acute abnormality.   Original Report Authenticated By: Alcide Clever, M.D.    Microbiology: No results found for this or any previous visit (from the past 240 hour(s)).   Labs: Basic Metabolic Panel:  Recent Labs Lab 11/12/12 1835 11/13/12 0440  NA 134* 134*  K 3.2* 4.0  CL 93* 98  CO2 33* 31  GLUCOSE 197* 180*  BUN 20 13  CREATININE 0.52 0.41*  CALCIUM 8.6 7.9*   Liver Function Tests: No results found for this basename: AST, ALT, ALKPHOS, BILITOT, PROT, ALBUMIN,  in the last 168 hours No results found for this basename: LIPASE, AMYLASE,  in the last 168 hours No results found for this basename: AMMONIA,  in the last 168 hours CBC:  Recent Labs Lab 11/12/12 1835 11/13/12 0440  WBC 10.2 8.0  NEUTROABS 7.3  --   HGB 13.8 12.4  HCT 40.0 37.9  MCV 84.9 86.3  PLT 258 237   Cardiac Enzymes: No results found for this basename: CKTOTAL, CKMB, CKMBINDEX, TROPONINI,  in the last 168 hours BNP: BNP (last 3 results) No results found for this basename: PROBNP,  in the last 8760 hours CBG:  Recent Labs Lab 11/12/12 2209 11/13/12 0803 11/13/12 1139 11/13/12 1709 11/13/12 2152  GLUCAP 252* 168* 208* 158* 159*       Signed:  Paw Karstens  [facility] 11/14/2012, 2:06  PM

## 2012-11-14 NOTE — Progress Notes (Signed)
Clinical Social Work  CSW met with patient and two dtrs at bedside. CSW provided bed offers and explained the requirement to pay for SNF placement up front.  After a lengthy discussion, patient and dtrs decided that they could not afford SNF placement and desire to speak with CM regarding HH. CSW made CM aware and suggested SW via [facility] to assist with placement if patient changes her mind. CSW spoke with family regarding transportation at DC and patient wants to go home in dtr's car. Patient and dtr report no safety concerns with patient transporting in car and is worried that ambulance would not be covered by Medicare.  CSW made MD aware of patient's decision. CSW is signing off but available if further needs arise.  [place], [place] [phone]

## 2012-11-14 NOTE — Progress Notes (Signed)
OT Cancellation Note  Patient Details Name: Janet Frazier MRN: [id number] DOB: Jun 02, 1930   Cancelled Treatment:    Reason Eval/Treat Not Completed: Pain limiting ability to participate;Other (comment) (pt adamently refused any activity due to pain)  Alethea Terhaar, Metro Kung 11/14/2012, 12:34 PM

## 2012-11-14 NOTE — Progress Notes (Signed)
Pt declined [email or web address] due to not having a computer at home

## 2012-11-14 NOTE — Progress Notes (Addendum)
Pt's arm less swollen, no longer warm and red. Arm elevated.  Will continue to monitor.

## 2012-11-28 ENCOUNTER — Other Ambulatory Visit: Payer: Self-pay | Admitting: *Deleted

## 2012-11-28 MED ORDER — TRAMADOL HCL 50 MG PO TABS: ORAL_TABLET | ORAL | Status: DC

## 2012-12-01 ENCOUNTER — Ambulatory Visit (INDEPENDENT_AMBULATORY_CARE_PROVIDER_SITE_OTHER): Payer: Medicare Other | Admitting: Ophthalmology

## 2012-12-15 ENCOUNTER — Non-Acute Institutional Stay (SKILLED_NURSING_FACILITY): Payer: Medicare Other | Admitting: Family

## 2012-12-15 ENCOUNTER — Encounter: Payer: Self-pay | Admitting: Family

## 2012-12-15 DIAGNOSIS — E119 Type 2 diabetes mellitus without complications: Secondary | ICD-10-CM

## 2012-12-15 DIAGNOSIS — I10 Essential (primary) hypertension: Secondary | ICD-10-CM

## 2012-12-15 NOTE — Progress Notes (Signed)
Patient ID: OTA EBERSOLE, female   DOB: [birth year], 79 y.o.   MRN: [id number] Date: 12/15/12  Facility: [facility]  Code Status:  DNR  Chief Complaint  Patient presents with  . Acute Visit    Dizziness  . Medical Managment of Chronic Issues    HPI: Pt is being followed for the medical management of chronic illnesses.  Pt c/o dizziness with postural change. Pt denies associated CP, HA, confusion, syncope, falls, n/v/d/fever/chills.  Pt reports dizziness is mild with posture change and is consistent. Pt endorses rest/relaxation relieves dizziness. Pt endorses safety precautions when experiencing dizziness and utilizing call light and staff for assistance.  No further issues and concerns voiced at present.      No Known Allergies   Medication List       This list is accurate as of: 12/15/12  3:35 PM.  Always use your most recent med list.               amLODipine 5 MG tablet  Commonly known as:  NORVASC  Take 2 tablets (10 mg total) by mouth daily.     aspirin EC 81 MG tablet  Take 81 mg by mouth every other day.     atenolol 25 MG tablet  Commonly known as:  TENORMIN  Take 25 mg by mouth daily.     calcium carbonate 600 MG Tabs tablet  Commonly known as:  OS-CAL  Take 600 mg by mouth daily with breakfast.     cyclobenzaprine 10 MG tablet  Commonly known as:  FLEXERIL  Take 5 mg by mouth 3 (three) times daily as needed for muscle spasms (pai).     denosumab 60 MG/ML Soln injection  Commonly known as:  PROLIA  Inject 60 mg into the skin every 6 (six) months. Administer in upper arm, thigh, or abdomen     ferrous fumarate 325 (106 FE) MG Tabs tablet  Commonly known as:  HEMOCYTE - 106 mg FE  Take 1 tablet by mouth every other day.     furosemide 20 MG tablet  Commonly known as:  LASIX  Take 20 mg by mouth every other day.     HYDROcodone-acetaminophen 5-325 MG per tablet  Commonly known as:  NORCO/VICODIN  Take 2 tablets by mouth every 4 (four) hours as  needed for pain.     ibuprofen 200 MG tablet  Commonly known as:  ADVIL,MOTRIN  Take 2 tablets (400 mg total) by mouth every 8 (eight) hours as needed for pain (with meals preferably if possible). For 1 week     insulin aspart 100 UNIT/ML injection  Commonly known as:  novoLOG  Inject 4-6 Units into the skin 3 (three) times daily with meals. 5units in am, 4units at lunch, and 6units in pm     insulin glargine 100 UNIT/ML injection  Commonly known as:  LANTUS  Inject 0.15 mLs (15 Units total) into the skin daily. Takes in the am     lansoprazole 30 MG capsule  Commonly known as:  PREVACID  Take 30 mg by mouth daily.     neomycin-polymyxin-pramoxine 1 % cream  Commonly known as:  NEOSPORIN PLUS  Apply 1 application topically 2 (two) times daily as needed (applied to wounds).     oxyCODONE 5 MG immediate release tablet  Commonly known as:  Oxy IR/ROXICODONE  Take 1 tablet (5 mg total) by mouth every 6 (six) hours as needed (for severe pain).     prochlorperazine 5  MG tablet  Commonly known as:  COMPAZINE  Take 5 mg by mouth every 6 (six) hours as needed for nausea.     traMADol 50 MG tablet  Commonly known as:  ULTRAM  Take one tablet by mouth every 8 hours for pain     Vitamin D3 2000 UNITS Tabs  Take 1 tablet by mouth daily.         DATA REVIEWED  Laboratory Studies: Labs ordered 11/27/12 Past Medical History  Diagnosis Date  . Osteoporosis   . Diabetes mellitus without complication   . GERD (gastroesophageal reflux disease)   . Back pain    Past Surgical History  Procedure Laterality Date  . Knee surgery Right   . Elbow surgery Left   . Eye surgery    . Abdominal surgery      History   Social History  . Marital Status: Single    Spouse Name: N/A    Number of Children: N/A  . Years of Education: N/A   Occupational History  . Not on file.   Social History Main Topics  . Smoking status: Former Games developer  . Smokeless tobacco: Never Used  . Alcohol Use:  No  . Drug Use: No  . Sexual Activity: Not on file   Other Topics Concern  . Not on file   Social History Narrative  . No narrative on file     Review of Systems  Constitutional: Negative.   HENT: Negative.   Eyes: Negative.   Respiratory: Negative.   Cardiovascular: Negative.   Gastrointestinal: Negative.   Genitourinary: Negative.   Musculoskeletal: Positive for back pain.  Skin: Negative.   Neurological: Positive for dizziness.  Endo/Heme/Allergies: Negative.   Psychiatric/Behavioral: Negative.      Physical Exam Filed Vitals:   12/15/12 1529  BP: 146/72  Pulse: 70  Temp: 97 F (36.1 C)  Resp: 18   There is no weight on file to calculate BMI. Physical Exam  Constitutional: She is oriented to person, place, and time. She appears well-developed. No distress.  HENT:  Mouth/Throat: Oropharynx is clear and moist.  Cardiovascular: Normal rate, regular rhythm, normal heart sounds and intact distal pulses.   Pulmonary/Chest: Effort normal and breath sounds normal.  Musculoskeletal:       Lumbar back: She exhibits decreased range of motion, tenderness and pain. She exhibits no swelling and no edema.  Neurological: She is alert and oriented to person, place, and time.  Psychiatric: She has a normal mood and affect. Her speech is normal and behavior is normal.    ASSESSMENT/PLAN  Hypertension-Continue with current treatment plan; Perform Orthostatic V/s Diabetes Mellitus-Continue with current treatment plan; reinforced diabetic diet with education on carbohydrates Chronic Back Pain-continue with non-pharmacologic and pharamacologic interventions  Follow up:prn

## 2012-12-23 ENCOUNTER — Non-Acute Institutional Stay (SKILLED_NURSING_FACILITY): Payer: Medicare Other | Admitting: Family

## 2012-12-23 ENCOUNTER — Encounter: Payer: Self-pay | Admitting: Family

## 2012-12-23 DIAGNOSIS — I10 Essential (primary) hypertension: Secondary | ICD-10-CM

## 2012-12-23 DIAGNOSIS — E119 Type 2 diabetes mellitus without complications: Secondary | ICD-10-CM

## 2012-12-23 NOTE — Progress Notes (Signed)
Patient ID: Janet Frazier, female   DOB: [birth year], 79 y.o.   MRN: [id number] Date: 12/23/12  Facility: [facility]  Code Status:  DNR  Chief Complaint  Patient presents with  . Discharge Note    HPI: Pt was admitted to [facility] for short-term rehabilitation. Pt is requesting discharge at present. Health care team and pt denies further concerns/issues at present.      No Known Allergies   Medication List       This list is accurate as of: 12/23/12 10:36 PM.  Always use your most recent med list.               amLODipine 5 MG tablet  Commonly known as:  NORVASC  Take 2 tablets (10 mg total) by mouth daily.     aspirin EC 81 MG tablet  Take 81 mg by mouth every other day.     atenolol 25 MG tablet  Commonly known as:  TENORMIN  Take 25 mg by mouth daily.     calcium carbonate 600 MG Tabs tablet  Commonly known as:  OS-CAL  Take 600 mg by mouth daily with breakfast.     cyclobenzaprine 10 MG tablet  Commonly known as:  FLEXERIL  Take 5 mg by mouth 3 (three) times daily as needed for muscle spasms (pai).     denosumab 60 MG/ML Soln injection  Commonly known as:  PROLIA  Inject 60 mg into the skin every 6 (six) months. Administer in upper arm, thigh, or abdomen     ferrous fumarate 325 (106 FE) MG Tabs tablet  Commonly known as:  HEMOCYTE - 106 mg FE  Take 1 tablet by mouth every other day.     furosemide 20 MG tablet  Commonly known as:  LASIX  Take 20 mg by mouth every other day.     HYDROcodone-acetaminophen 5-325 MG per tablet  Commonly known as:  NORCO/VICODIN  Take 2 tablets by mouth every 4 (four) hours as needed for pain.     ibuprofen 200 MG tablet  Commonly known as:  ADVIL,MOTRIN  Take 2 tablets (400 mg total) by mouth every 8 (eight) hours as needed for pain (with meals preferably if possible). For 1 week     insulin aspart 100 UNIT/ML injection  Commonly known as:  novoLOG  Inject 4-6 Units into the skin 3  (three) times daily with meals. 5units in am, 4units at lunch, and 6units in pm     insulin glargine 100 UNIT/ML injection  Commonly known as:  LANTUS  Inject 0.15 mLs (15 Units total) into the skin daily. Takes in the am     lansoprazole 30 MG capsule  Commonly known as:  PREVACID  Take 30 mg by mouth daily.     neomycin-polymyxin-pramoxine 1 % cream  Commonly known as:  NEOSPORIN PLUS  Apply 1 application topically 2 (two) times daily as needed (applied to wounds).     oxyCODONE 5 MG immediate release tablet  Commonly known as:  Oxy IR/ROXICODONE  Take 1 tablet (5 mg total) by mouth every 6 (six) hours as needed (for severe pain).     prochlorperazine 5 MG tablet  Commonly known as:  COMPAZINE  Take 5 mg by mouth every 6 (six) hours as needed for nausea.     traMADol 50 MG tablet  Commonly known as:  ULTRAM  Take one tablet by mouth every 8 hours for pain     Vitamin  D3 2000 UNITS Tabs  Take 1 tablet by mouth daily.         DATA REVIEWED  Labs ordered 11/27/12-Not present in chart  Past Medical History  Diagnosis Date  . Osteoporosis   . Diabetes mellitus without complication   . GERD (gastroesophageal reflux disease)   . Back pain      Past Surgical History  Procedure Laterality Date  . Knee surgery Right   . Elbow surgery Left   . Eye surgery    . Abdominal surgery        History   Social History  . Marital Status: Single    Spouse Name: N/A    Number of Children: N/A  . Years of Education: N/A   Occupational History  . Not on file.   Social History Main Topics  . Smoking status: Former Games developer  . Smokeless tobacco: Never Used  . Alcohol Use: No  . Drug Use: No  . Sexual Activity: Not on file   Other Topics Concern  . Not on file   Social History Narrative  . No narrative on file    Review of Systems  Constitutional: Negative.   HENT: Negative.   Eyes: Negative.   Respiratory: Negative.   Cardiovascular: Negative.    Gastrointestinal: Negative.   Genitourinary: Negative.   Musculoskeletal: Negative.   Skin: Negative.   Neurological: Positive for dizziness.  Endo/Heme/Allergies:       Diabetic   Psychiatric/Behavioral: Negative.   'Physical Exam Filed Vitals:   12/23/12 2232  BP: 120/66  Pulse: 68  Temp: 97.8 F (36.6 C)  Resp: 19   There is no weight on file to calculate BMI. Physical Exam Constitutional: She is oriented to person, place, and time. She appears well-developed. No distress.  HENT:  Mouth/Throat: Oropharynx is clear and moist.  Cardiovascular: Normal rate, regular rhythm, normal heart sounds and intact distal pulses.   Pulmonary/Chest: Effort normal and breath sounds normal.  Musculoskeletal:       Lumbar back: She exhibits decreased range of motion, tenderness and pain. She exhibits no swelling and no edema.  Neurological: She is alert and oriented to person, place, and time.  Psychiatric: She has a normial mood and affect. Her speech is normal and behavior is normal.  ASSESSMENT/PLAN  Stat CBC and CMP ordered 10/21 Discharge to Home with [facility], PT and OT  Thursday 12/25/12 Prescription provided for discharge

## 2013-02-04 ENCOUNTER — Other Ambulatory Visit (HOSPITAL_COMMUNITY): Payer: Self-pay | Admitting: Orthopaedic Surgery

## 2013-02-04 ENCOUNTER — Other Ambulatory Visit (HOSPITAL_COMMUNITY): Payer: Self-pay | Admitting: *Deleted

## 2013-02-04 ENCOUNTER — Encounter (HOSPITAL_COMMUNITY): Payer: Self-pay | Admitting: *Deleted

## 2013-02-05 ENCOUNTER — Ambulatory Visit (HOSPITAL_COMMUNITY): Payer: Medicare Other | Admitting: Anesthesiology

## 2013-02-05 ENCOUNTER — Encounter (HOSPITAL_COMMUNITY)
Admission: RE | Disposition: A | Discharge status: Home / Self Care | Payer: Self-pay | Source: Ambulatory Visit | Attending: Orthopaedic Surgery

## 2013-02-05 ENCOUNTER — Ambulatory Visit (HOSPITAL_COMMUNITY)
Admission: RE | Admit: 2013-02-05 | Discharge: 2013-02-05 | Disposition: A | Discharge status: Home / Self Care | Payer: Medicare Other | Source: Ambulatory Visit | Attending: Orthopaedic Surgery | Admitting: Orthopaedic Surgery

## 2013-02-05 ENCOUNTER — Encounter (HOSPITAL_COMMUNITY): Payer: Self-pay | Admitting: *Deleted

## 2013-02-05 ENCOUNTER — Encounter (HOSPITAL_COMMUNITY): Payer: Medicare Other | Admitting: Anesthesiology

## 2013-02-05 DIAGNOSIS — Z01812 Encounter for preprocedural laboratory examination: Secondary | ICD-10-CM | POA: Insufficient documentation

## 2013-02-05 DIAGNOSIS — K5090 Crohn's disease, unspecified, without complications: Secondary | ICD-10-CM | POA: Insufficient documentation

## 2013-02-05 DIAGNOSIS — E119 Type 2 diabetes mellitus without complications: Secondary | ICD-10-CM | POA: Insufficient documentation

## 2013-02-05 DIAGNOSIS — M549 Dorsalgia, unspecified: Secondary | ICD-10-CM | POA: Insufficient documentation

## 2013-02-05 DIAGNOSIS — I10 Essential (primary) hypertension: Secondary | ICD-10-CM | POA: Insufficient documentation

## 2013-02-05 DIAGNOSIS — K219 Gastro-esophageal reflux disease without esophagitis: Secondary | ICD-10-CM | POA: Insufficient documentation

## 2013-02-05 DIAGNOSIS — T84498A Other mechanical complication of other internal orthopedic devices, implants and grafts, initial encounter: Secondary | ICD-10-CM | POA: Insufficient documentation

## 2013-02-05 DIAGNOSIS — M810 Age-related osteoporosis without current pathological fracture: Secondary | ICD-10-CM | POA: Insufficient documentation

## 2013-02-05 DIAGNOSIS — E876 Hypokalemia: Secondary | ICD-10-CM | POA: Insufficient documentation

## 2013-02-05 DIAGNOSIS — Y831 Surgical operation with implant of artificial internal device as the cause of abnormal reaction of the patient, or of later complication, without mention of misadventure at the time of the procedure: Secondary | ICD-10-CM | POA: Insufficient documentation

## 2013-02-05 DIAGNOSIS — Z8619 Personal history of other infectious and parasitic diseases: Secondary | ICD-10-CM | POA: Insufficient documentation

## 2013-02-05 DIAGNOSIS — Z9889 Other specified postprocedural states: Secondary | ICD-10-CM | POA: Insufficient documentation

## 2013-02-05 DIAGNOSIS — Z794 Long term (current) use of insulin: Secondary | ICD-10-CM | POA: Insufficient documentation

## 2013-02-05 DIAGNOSIS — Z87891 Personal history of nicotine dependence: Secondary | ICD-10-CM | POA: Insufficient documentation

## 2013-02-05 HISTORY — DX: Inflammatory liver disease, unspecified: K75.9

## 2013-02-05 HISTORY — DX: Essential (primary) hypertension: I10

## 2013-02-05 HISTORY — DX: Crohn's disease, unspecified, without complications: K50.90

## 2013-02-05 HISTORY — DX: Unspecified osteoarthritis, unspecified site: M19.90

## 2013-02-05 LAB — CBC
HCT: 40.2 % (ref 36.0–46.0)
Hemoglobin: 14 g/dL (ref 12.0–15.0)
MCV: 84.8 fL (ref 78.0–100.0)
RBC: 4.74 MIL/uL (ref 3.87–5.11)
WBC: 7.6 10*3/uL (ref 4.0–10.5)

## 2013-02-05 LAB — POCT I-STAT 4, (NA,K, GLUC, HGB,HCT)
Glucose, Bld: 92 mg/dL (ref 70–99)
HCT: 41 % (ref 36.0–46.0)
Hemoglobin: 13.9 g/dL (ref 12.0–15.0)
Potassium: 2.6 mEq/L — CL (ref 3.5–5.1)
Sodium: 141 mEq/L (ref 135–145)

## 2013-02-05 LAB — BASIC METABOLIC PANEL
CO2: 33 mEq/L — ABNORMAL HIGH (ref 19–32)
Chloride: 97 mEq/L (ref 96–112)
Creatinine, Ser: 0.39 mg/dL — ABNORMAL LOW (ref 0.50–1.10)
Potassium: 2.6 mEq/L — CL (ref 3.5–5.1)
Sodium: 140 mEq/L (ref 135–145)

## 2013-02-05 SURGERY — CANCELLED PROCEDURE
Anesthesia: General

## 2013-02-05 MED ORDER — DEXTROSE 50 % IV SOLN
INTRAVENOUS | Status: AC
  Filled 2013-02-05: qty 50

## 2013-02-05 MED ORDER — DEXTROSE 50 % IV SOLN
25.0000 mL | Freq: Once | INTRAVENOUS | Status: DC | PRN
  Filled 2013-02-05: qty 50

## 2013-02-05 MED ORDER — CEFAZOLIN SODIUM-DEXTROSE 2-3 GM-% IV SOLR
INTRAVENOUS | Status: AC
  Filled 2013-02-05: qty 50

## 2013-02-05 MED ORDER — LACTATED RINGERS IV SOLN
INTRAVENOUS | Status: DC
  Administered 2013-02-05: 13:00:00 via INTRAVENOUS

## 2013-02-05 MED ORDER — DEXTROSE 50 % IV SOLN
25.0000 mL | Freq: Once | INTRAVENOUS | Status: AC
  Administered 2013-02-05: 25 mL via INTRAVENOUS
  Filled 2013-02-05: qty 50

## 2013-02-05 MED ORDER — BUPIVACAINE HCL (PF) 0.25 % IJ SOLN
INTRAMUSCULAR | Status: AC
  Filled 2013-02-05: qty 30

## 2013-02-05 MED ORDER — CEFAZOLIN SODIUM-DEXTROSE 2-3 GM-% IV SOLR: 2.0000 g | INTRAVENOUS | Status: DC

## 2013-02-05 SURGICAL SUPPLY — 51 items
APL SKNCLS STERI-STRIP NONHPOA (GAUZE/BANDAGES/DRESSINGS)
BANDAGE ELASTIC 3 VELCRO ST LF (GAUZE/BANDAGES/DRESSINGS) IMPLANT
BANDAGE ELASTIC 4 VELCRO ST LF (GAUZE/BANDAGES/DRESSINGS) ×6 IMPLANT
BANDAGE GAUZE ELAST BULKY 4 IN (GAUZE/BANDAGES/DRESSINGS) ×3 IMPLANT
BENZOIN TINCTURE PRP APPL 2/3 (GAUZE/BANDAGES/DRESSINGS) IMPLANT
BLADE SURG 10 STRL SS (BLADE) IMPLANT
BLADE SURG ROTATE 9660 (MISCELLANEOUS) IMPLANT
BNDG CMPR 9X4 STRL LF SNTH (GAUZE/BANDAGES/DRESSINGS) ×1
BNDG COHESIVE 4X5 TAN STRL (GAUZE/BANDAGES/DRESSINGS) ×3 IMPLANT
BNDG ESMARK 4X9 LF (GAUZE/BANDAGES/DRESSINGS) ×3 IMPLANT
CLEANER TIP ELECTROSURG 2X2 (MISCELLANEOUS) ×3 IMPLANT
CLOTH BEACON ORANGE TIMEOUT ST (SAFETY) ×3 IMPLANT
COVER SURGICAL LIGHT HANDLE (MISCELLANEOUS) ×3 IMPLANT
CUFF TOURNIQUET SINGLE 18IN (TOURNIQUET CUFF) ×3 IMPLANT
CUFF TOURNIQUET SINGLE 24IN (TOURNIQUET CUFF) IMPLANT
DRAPE C-ARM 42X72 X-RAY (DRAPES) ×3 IMPLANT
DRAPE INCISE IOBAN 66X45 STRL (DRAPES) ×3 IMPLANT
DRAPE U-SHAPE 47X51 STRL (DRAPES) ×3 IMPLANT
ELECT REM PT RETURN 9FT ADLT (ELECTROSURGICAL) ×2
ELECTRODE REM PT RTRN 9FT ADLT (ELECTROSURGICAL) ×2 IMPLANT
FACESHIELD LNG OPTICON STERILE (SAFETY) IMPLANT
GAUZE XEROFORM 5X9 LF (GAUZE/BANDAGES/DRESSINGS) ×3 IMPLANT
GLOVE SURG SS PI 7.5 STRL IVOR (GLOVE) ×12 IMPLANT
GOWN PREVENTION PLUS LG XLONG (DISPOSABLE) IMPLANT
GOWN STRL NON-REIN LRG LVL3 (GOWN DISPOSABLE) ×6 IMPLANT
GOWN STRL REIN XL XLG (GOWN DISPOSABLE) ×3 IMPLANT
KIT BASIN OR (CUSTOM PROCEDURE TRAY) ×3 IMPLANT
KIT ROOM TURNOVER OR (KITS) ×3 IMPLANT
MANIFOLD NEPTUNE II (INSTRUMENTS) ×3 IMPLANT
NS IRRIG 1000ML POUR BTL (IV SOLUTION) ×3 IMPLANT
PACK ORTHO EXTREMITY (CUSTOM PROCEDURE TRAY) ×3 IMPLANT
PAD ARMBOARD 7.5X6 YLW CONV (MISCELLANEOUS) ×6 IMPLANT
PAD CAST 4YDX4 CTTN HI CHSV (CAST SUPPLIES) ×2 IMPLANT
PADDING CAST COTTON 4X4 STRL (CAST SUPPLIES) ×2
SPONGE GAUZE 4X4 12PLY (GAUZE/BANDAGES/DRESSINGS) ×3 IMPLANT
SPONGE LAP 18X18 X RAY DECT (DISPOSABLE) ×6 IMPLANT
STAPLER VISISTAT 35W (STAPLE) ×3 IMPLANT
STRIP CLOSURE SKIN 1/2X4 (GAUZE/BANDAGES/DRESSINGS) IMPLANT
SUCTION FRAZIER TIP 10 FR DISP (SUCTIONS) ×3 IMPLANT
SUT PROLENE 3 0 PS 2 (SUTURE) ×6 IMPLANT
SUT VIC AB 0 CT1 27 (SUTURE) ×4
SUT VIC AB 0 CT1 27XBRD ANBCTR (SUTURE) ×4 IMPLANT
SUT VIC AB 2-0 CT1 27 (SUTURE) ×4
SUT VIC AB 2-0 CT1 TAPERPNT 27 (SUTURE) ×4 IMPLANT
SYR CONTROL 10ML LL (SYRINGE) IMPLANT
TOWEL OR 17X24 6PK STRL BLUE (TOWEL DISPOSABLE) IMPLANT
TOWEL OR 17X26 10 PK STRL BLUE (TOWEL DISPOSABLE) ×3 IMPLANT
TUBE CONNECTING 12X1/4 (SUCTIONS) ×3 IMPLANT
UNDERPAD 30X30 INCONTINENT (UNDERPADS AND DIAPERS) ×3 IMPLANT
WATER STERILE IRR 1000ML POUR (IV SOLUTION) ×3 IMPLANT
YANKAUER SUCT BULB TIP NO VENT (SUCTIONS) ×3 IMPLANT

## 2013-02-05 NOTE — Preoperative (Signed)
Beta Blockers   Reason not to administer Beta Blockers:Not Applicable 

## 2013-02-05 NOTE — Progress Notes (Signed)
Potassium recheck was 2.6 Dr Jean Rosenthal saw results.

## 2013-02-05 NOTE — Progress Notes (Addendum)
Pt did not take her Atenolol this am, her heart rate was 59 so med not given. Pt took her Novolog insulin  this am (7 Units).

## 2013-02-05 NOTE — Treatment Plan (Signed)
This is a note regarding Ms. Hadden's potassium levels. After discussion with Dr. Jean Rosenthal anesthesia, the best course of action is to cancel surgery for now given the fact the patient is not septic and is stable.  I will go ahead and set her up with an urgent appointment with her primary care doctor Dr. Shary Decamp for management of this potassium. I like to have this surgery done on Monday if possible. This was discussed with the patient and the patient's daughter and everyone is in agreement for canceling the surgery today and doing this early next week.  Mayra Reel, MD [facility] [phone] 2:33 PM

## 2013-02-05 NOTE — Anesthesia Preprocedure Evaluation (Addendum)
Anesthesia Evaluation  Patient identified by MRN, date of birth, ID band Patient awake    Reviewed: Allergy & Precautions, H&P , NPO status , Patient's Chart, lab work & pertinent test results  History of Anesthesia Complications Negative for: history of anesthetic complications  Airway Mallampati: II TM Distance: >3 FB Neck ROM: Full    Dental  (+) Teeth Intact and Dental Advisory Given   Pulmonary former smoker,  breath sounds clear to auscultation  Pulmonary exam normal       Cardiovascular hypertension, Pt. on medications and Pt. on home beta blockers Rhythm:Regular Rate:Normal     Neuro/Psych negative neurological ROS     GI/Hepatic GERD-  Medicated and Controlled,(+) Hepatitis -  Endo/Other  diabetes (glu 140 after 0.5 amp D50), Insulin Dependent  Renal/GU      Musculoskeletal   Abdominal   Peds  Hematology   Anesthesia Other Findings hypokalemia  Reproductive/Obstetrics                      Anesthesia Physical Anesthesia Plan  ASA: III  Anesthesia Plan: General   Post-op Pain Management:    Induction: Intravenous  Airway Management Planned: LMA and Oral ETT  Additional Equipment:   Intra-op Plan:   Post-operative Plan: Extubation in OR  Informed Consent: I have reviewed the patients History and Physical, chart, labs and discussed the procedure including the risks, benefits and alternatives for the proposed anesthesia with the patient or authorized representative who has indicated his/her understanding and acceptance.     Plan Discussed with: CRNA and Surgeon  Anesthesia Plan Comments: (Plan routine monitors, GA- LMA OK Pt's K+ 2.6, Dr. Roda Shutters to postpone until potassium repleted)     Anesthesia Quick Evaluation

## 2013-02-05 NOTE — Progress Notes (Signed)
Patient discharged home-- to come back another day.Marland KitchenPermit and other papers given to [employer], NS.  DA

## 2013-02-05 NOTE — Progress Notes (Signed)
Received call from [facility] in laboratory that pt's potassium is 2.6. Informed Dr. Jairo Ben and order received to do I-Stat when her next cbg.

## 2013-02-05 NOTE — Progress Notes (Addendum)
Hypoglycemic Event  CBG: 61  Treatment: D50 IV 25 mL  Symptoms: None  Follow-up CBG time 1235 CBG Result: 140  Possible Reasons for Event: Inadequate meal intake  Comments/MD notified:Dr Lonia Mad, [facility]re[facility]  Remember to initiate Hypoglycemia Order Set & complete

## 2013-02-05 NOTE — H&P (Signed)
PREOPERATIVE H&P  Chief Complaint: Left elbow exposed hardware  HPI: Janet Frazier is a 79 y.o. female who presents for surgical treatment of exposed hardware of the left elbow.  Denies any interval changes to medical history.    Past Medical History  Diagnosis Date  . Osteoporosis   . Diabetes mellitus without complication   . GERD (gastroesophageal reflux disease)   . Back pain   . Hypertension   . Arthritis   . Hepatitis     as a child  . Crohn's disease    Past Surgical History  Procedure Laterality Date  . Knee surgery Right   . Elbow surgery Left   . Abdominal surgery    . Eye surgery      bilateral cataracts  . Colonoscopy     History   Social History  . Marital Status: Single    Spouse Name: N/A    Number of Children: N/A  . Years of Education: N/A   Social History Main Topics  . Smoking status: Former Games developer  . Smokeless tobacco: Never Used  . Alcohol Use: No  . Drug Use: No  . Sexual Activity: None   Other Topics Concern  . None   Social History Narrative  . None   History reviewed. No pertinent family history. No Known Allergies Prior to Admission medications   Medication Sig Start Date End Date Taking? Authorizing Provider  amLODipine (NORVASC) 5 MG tablet Take 2 tablets (10 mg total) by mouth daily. 11/14/12   Zannie Cove, MD  aspirin EC 81 MG tablet Take 81 mg by mouth every other day.     Historical Provider, MD  atenolol (TENORMIN) 25 MG tablet Take 25 mg by mouth daily.    Historical Provider, MD  calcium carbonate (OS-CAL) 600 MG TABS tablet Take 600 mg by mouth daily with breakfast.    Historical Provider, MD  Cholecalciferol (VITAMIN D3) 2000 UNITS TABS Take 1 tablet by mouth daily.    Historical Provider, MD  cyclobenzaprine (FLEXERIL) 10 MG tablet Take 5 mg by mouth 3 (three) times daily as needed for muscle spasms (pai).     Historical Provider, MD  denosumab (PROLIA) 60 MG/ML SOLN injection Inject 60 mg into the skin every 6  (six) months. Administer in upper arm, thigh, or abdomen    Historical Provider, MD  ferrous fumarate (HEMOCYTE - 106 MG FE) 325 (106 FE) MG TABS Take 1 tablet by mouth every other day.    Historical Provider, MD  furosemide (LASIX) 20 MG tablet Take 20 mg by mouth every other day.    Historical Provider, MD  HYDROcodone-acetaminophen (NORCO/VICODIN) 5-325 MG per tablet Take 2 tablets by mouth every 4 (four) hours as needed for pain. 11/05/12   Toy Baker, MD  ibuprofen (ADVIL,MOTRIN) 200 MG tablet Take 2 tablets (400 mg total) by mouth every 8 (eight) hours as needed for pain (with meals preferably if possible). For 1 week 11/14/12   Zannie Cove, MD  insulin aspart (NOVOLOG) 100 UNIT/ML injection Inject 4-6 Units into the skin 3 (three) times daily with meals. 5units in am, 4units at lunch, and 6units in pm    Historical Provider, MD  insulin glargine (LANTUS) 100 UNIT/ML injection Inject 0.15 mLs (15 Units total) into the skin daily. Takes in the am 11/14/12   Zannie Cove, MD  lansoprazole (PREVACID) 30 MG capsule Take 30 mg by mouth daily.    Historical Provider, MD  neomycin-polymyxin-pramoxine (NEOSPORIN PLUS) 1 % cream  Apply 1 application topically 2 (two) times daily as needed (applied to wounds).     Historical Provider, MD  oxyCODONE (OXY IR/ROXICODONE) 5 MG immediate release tablet Take 1 tablet (5 mg total) by mouth every 6 (six) hours as needed (for severe pain). 11/14/12   Zannie Cove, MD  prochlorperazine (COMPAZINE) 5 MG tablet Take 5 mg by mouth every 6 (six) hours as needed for nausea.     Historical Provider, MD  traMADol (ULTRAM) 50 MG tablet Take one tablet by mouth every 8 hours for pain 11/28/12   Tiffany L Reed, DO     Positive ROS: All other systems have been reviewed and were otherwise negative with the exception of those mentioned in the HPI and as above.  Physical Exam: General: Alert, no acute distress Cardiovascular: No pedal edema Respiratory: No cyanosis, no  use of accessory musculature GI: No organomegaly, abdomen is soft and non-tender Skin: No lesions in the area of chief complaint Neurologic: Sensation intact distally Psychiatric: Patient is competent for consent with normal mood and affect Lymphatic: No axillary or cervical lymphadenopathy  MUSCULOSKELETAL:   LUE - exposed hardware - slight erythema surrounding plate - no drainage - NVI  Assessment: Left elbow exposed hardware  Plan: Plan for Procedure(s): HARDWARE REMOVAL, IRRIGATION AND DEBRIDEMENT LEFT ELBOW, POSSIBLE VAC  The risks benefits and alternatives were discussed with the patient including but not limited to the risks of nonoperative treatment, versus surgical intervention including infection, bleeding, nerve injury,  blood clots, cardiopulmonary complications, morbidity, mortality, among others, and they were willing to proceed.   Cheral Almas, MD   02/05/2013 11:08 AM

## 2013-02-09 ENCOUNTER — Ambulatory Visit (HOSPITAL_COMMUNITY)
Admission: RE | Admit: 2013-02-09 | Discharge: 2013-02-09 | Disposition: A | Discharge status: Home / Self Care | Payer: Medicare Other | Source: Ambulatory Visit | Attending: Orthopaedic Surgery | Admitting: Orthopaedic Surgery

## 2013-02-09 ENCOUNTER — Encounter (HOSPITAL_COMMUNITY): Payer: Self-pay | Admitting: *Deleted

## 2013-02-09 ENCOUNTER — Encounter (HOSPITAL_COMMUNITY)
Admission: RE | Disposition: A | Discharge status: Home / Self Care | Payer: Self-pay | Source: Ambulatory Visit | Attending: Orthopaedic Surgery

## 2013-02-09 ENCOUNTER — Encounter (HOSPITAL_COMMUNITY): Payer: Medicare Other

## 2013-02-09 ENCOUNTER — Ambulatory Visit (HOSPITAL_COMMUNITY): Payer: Medicare Other

## 2013-02-09 DIAGNOSIS — Z8673 Personal history of transient ischemic attack (TIA), and cerebral infarction without residual deficits: Secondary | ICD-10-CM | POA: Insufficient documentation

## 2013-02-09 DIAGNOSIS — T84498A Other mechanical complication of other internal orthopedic devices, implants and grafts, initial encounter: Secondary | ICD-10-CM | POA: Insufficient documentation

## 2013-02-09 DIAGNOSIS — I10 Essential (primary) hypertension: Secondary | ICD-10-CM | POA: Insufficient documentation

## 2013-02-09 DIAGNOSIS — K219 Gastro-esophageal reflux disease without esophagitis: Secondary | ICD-10-CM | POA: Insufficient documentation

## 2013-02-09 DIAGNOSIS — Z87891 Personal history of nicotine dependence: Secondary | ICD-10-CM | POA: Insufficient documentation

## 2013-02-09 DIAGNOSIS — Z794 Long term (current) use of insulin: Secondary | ICD-10-CM | POA: Insufficient documentation

## 2013-02-09 DIAGNOSIS — E119 Type 2 diabetes mellitus without complications: Secondary | ICD-10-CM

## 2013-02-09 DIAGNOSIS — Y831 Surgical operation with implant of artificial internal device as the cause of abnormal reaction of the patient, or of later complication, without mention of misadventure at the time of the procedure: Secondary | ICD-10-CM | POA: Insufficient documentation

## 2013-02-09 HISTORY — PX: HARDWARE REMOVAL: SHX979

## 2013-02-09 LAB — POCT I-STAT 4, (NA,K, GLUC, HGB,HCT)
Glucose, Bld: 310 mg/dL — ABNORMAL HIGH (ref 70–99)
HCT: 44 % (ref 36.0–46.0)
Hemoglobin: 15 g/dL (ref 12.0–15.0)
Potassium: 4 mEq/L (ref 3.5–5.1)

## 2013-02-09 LAB — GLUCOSE, CAPILLARY: Glucose-Capillary: 210 mg/dL — ABNORMAL HIGH (ref 70–99)

## 2013-02-09 SURGERY — REMOVAL, HARDWARE
Anesthesia: General | Site: Elbow | Laterality: Left

## 2013-02-09 MED ORDER — LACTATED RINGERS IV SOLN
INTRAVENOUS | Status: DC | PRN
  Administered 2013-02-09: 07:00:00 via INTRAVENOUS

## 2013-02-09 MED ORDER — PHENYLEPHRINE HCL 10 MG/ML IJ SOLN
INTRAMUSCULAR | Status: DC | PRN
  Administered 2013-02-09 (×3): 80 ug via INTRAVENOUS
  Administered 2013-02-09: 40 ug via INTRAVENOUS

## 2013-02-09 MED ORDER — FENTANYL CITRATE 0.05 MG/ML IJ SOLN: 50.0000 ug | Freq: Once | INTRAMUSCULAR | Status: DC

## 2013-02-09 MED ORDER — SULFAMETHOXAZOLE-TMP DS 800-160 MG PO TABS: 1.0000 | ORAL_TABLET | Freq: Two times a day (BID) | ORAL | Status: DC

## 2013-02-09 MED ORDER — ATENOLOL 25 MG PO TABS
25.0000 mg | ORAL_TABLET | Freq: Once | ORAL | Status: AC
  Administered 2013-02-09: 25 mg via ORAL
  Filled 2013-02-09 (×2): qty 1

## 2013-02-09 MED ORDER — INSULIN ASPART 100 UNIT/ML ~~LOC~~ SOLN
SUBCUTANEOUS | Status: AC
  Filled 2013-02-09: qty 1

## 2013-02-09 MED ORDER — PROPOFOL 10 MG/ML IV BOLUS
INTRAVENOUS | Status: DC | PRN
  Administered 2013-02-09: 140 mg via INTRAVENOUS

## 2013-02-09 MED ORDER — INSULIN ASPART 100 UNIT/ML ~~LOC~~ SOLN
4.0000 [IU] | Freq: Once | SUBCUTANEOUS | Status: AC
  Administered 2013-02-09: 4 [IU] via SUBCUTANEOUS

## 2013-02-09 MED ORDER — VANCOMYCIN HCL IN DEXTROSE 1-5 GM/200ML-% IV SOLN
INTRAVENOUS | Status: AC
  Administered 2013-02-09: 1000 mg via INTRAVENOUS
  Filled 2013-02-09: qty 200

## 2013-02-09 MED ORDER — ONDANSETRON HCL 4 MG/2ML IJ SOLN
INTRAMUSCULAR | Status: DC | PRN
  Administered 2013-02-09: 4 mg via INTRAVENOUS

## 2013-02-09 MED ORDER — GLYCOPYRROLATE 0.2 MG/ML IJ SOLN
INTRAMUSCULAR | Status: DC | PRN
  Administered 2013-02-09: 0.2 mg via INTRAVENOUS

## 2013-02-09 MED ORDER — HYDROCODONE-ACETAMINOPHEN 5-325 MG PO TABS: 1.0000 | ORAL_TABLET | Freq: Four times a day (QID) | ORAL | Status: DC | PRN

## 2013-02-09 MED ORDER — MIDAZOLAM HCL 2 MG/2ML IJ SOLN: 1.0000 mg | INTRAMUSCULAR | Status: DC | PRN

## 2013-02-09 MED ORDER — FENTANYL CITRATE 0.05 MG/ML IJ SOLN
INTRAMUSCULAR | Status: DC | PRN
  Administered 2013-02-09: 25 ug via INTRAVENOUS
  Administered 2013-02-09: 50 ug via INTRAVENOUS
  Administered 2013-02-09: 25 ug via INTRAVENOUS

## 2013-02-09 SURGICAL SUPPLY — 58 items
BANDAGE CONFORM 3  STR LF (GAUZE/BANDAGES/DRESSINGS) IMPLANT
BANDAGE ELASTIC 3 VELCRO ST LF (GAUZE/BANDAGES/DRESSINGS) IMPLANT
BANDAGE ELASTIC 4 VELCRO ST LF (GAUZE/BANDAGES/DRESSINGS) ×1 IMPLANT
BLADE 15 SAFETY STRL DISP (BLADE) ×1 IMPLANT
BLADE SURG 10 STRL SS (BLADE) ×2 IMPLANT
BNDG COHESIVE 1X5 TAN STRL LF (GAUZE/BANDAGES/DRESSINGS) IMPLANT
BNDG COHESIVE 4X5 TAN STRL (GAUZE/BANDAGES/DRESSINGS) ×2 IMPLANT
BNDG COHESIVE 6X5 TAN STRL LF (GAUZE/BANDAGES/DRESSINGS) ×4 IMPLANT
BNDG GAUZE STRTCH 6 (GAUZE/BANDAGES/DRESSINGS) ×6 IMPLANT
CORDS BIPOLAR (ELECTRODE) IMPLANT
COVER SURGICAL LIGHT HANDLE (MISCELLANEOUS) ×2 IMPLANT
CUFF TOURNIQUET SINGLE 18IN (TOURNIQUET CUFF) ×2 IMPLANT
CUFF TOURNIQUET SINGLE 34IN LL (TOURNIQUET CUFF) IMPLANT
CUFF TOURNIQUET SINGLE 44IN (TOURNIQUET CUFF) IMPLANT
DRAPE OEC MINIVIEW 54X84 (DRAPES) ×1 IMPLANT
DRAPE ORTHO SPLIT 77X108 STRL (DRAPES) ×4
DRAPE SURG 17X23 STRL (DRAPES) IMPLANT
DRAPE SURG ORHT 6 SPLT 77X108 (DRAPES) ×2 IMPLANT
DRAPE U-SHAPE 47X51 STRL (DRAPES) ×2 IMPLANT
DURAPREP 26ML APPLICATOR (WOUND CARE) ×2 IMPLANT
ELECT CAUTERY BLADE 6.4 (BLADE) ×1 IMPLANT
ELECT REM PT RETURN 9FT ADLT (ELECTROSURGICAL)
ELECTRODE REM PT RTRN 9FT ADLT (ELECTROSURGICAL) IMPLANT
GAUZE XEROFORM 1X8 LF (GAUZE/BANDAGES/DRESSINGS) ×2 IMPLANT
GLOVE BIO SURGEON STRL SZ7 (GLOVE) ×1 IMPLANT
GLOVE BIO SURGEON STRL SZ7.5 (GLOVE) ×1 IMPLANT
GLOVE BIOGEL PI IND STRL 7.0 (GLOVE) IMPLANT
GLOVE BIOGEL PI INDICATOR 7.0 (GLOVE) ×1
GLOVE SURG SS PI 7.0 STRL IVOR (GLOVE) ×1 IMPLANT
GLOVE SURG SS PI 7.5 STRL IVOR (GLOVE) ×4 IMPLANT
GOWN STRL NON-REIN LRG LVL3 (GOWN DISPOSABLE) ×2 IMPLANT
GOWN STRL REIN XL XLG (GOWN DISPOSABLE) ×2 IMPLANT
HANDPIECE INTERPULSE COAX TIP (DISPOSABLE)
KIT BASIN OR (CUSTOM PROCEDURE TRAY) ×2 IMPLANT
KIT ROOM TURNOVER OR (KITS) ×2 IMPLANT
MANIFOLD NEPTUNE II (INSTRUMENTS) ×2 IMPLANT
NS IRRIG 1000ML POUR BTL (IV SOLUTION) ×2 IMPLANT
PACK ORTHO EXTREMITY (CUSTOM PROCEDURE TRAY) ×2 IMPLANT
PAD ARMBOARD 7.5X6 YLW CONV (MISCELLANEOUS) ×4 IMPLANT
PADDING CAST ABS 4INX4YD NS (CAST SUPPLIES) ×2
PADDING CAST ABS COTTON 4X4 ST (CAST SUPPLIES) ×2 IMPLANT
PADDING CAST COTTON 6X4 STRL (CAST SUPPLIES) ×2 IMPLANT
SET HNDPC FAN SPRY TIP SCT (DISPOSABLE) IMPLANT
SPONGE GAUZE 4X4 12PLY (GAUZE/BANDAGES/DRESSINGS) ×2 IMPLANT
SPONGE LAP 18X18 X RAY DECT (DISPOSABLE) ×2 IMPLANT
STOCKINETTE IMPERVIOUS 9X36 MD (GAUZE/BANDAGES/DRESSINGS) ×2 IMPLANT
SUT ETHILON 2 0 FS 18 (SUTURE) ×6 IMPLANT
SUT ETHILON 3 0 PS 1 (SUTURE) ×4 IMPLANT
SUT MON AB 2-0 CT1 36 (SUTURE) ×2 IMPLANT
SYR CONTROL 10ML LL (SYRINGE) IMPLANT
TOWEL OR 17X24 6PK STRL BLUE (TOWEL DISPOSABLE) ×2 IMPLANT
TOWEL OR 17X26 10 PK STRL BLUE (TOWEL DISPOSABLE) ×2 IMPLANT
TUBE ANAEROBIC SPECIMEN COL (MISCELLANEOUS) IMPLANT
TUBE CONNECTING 12X1/4 (SUCTIONS) ×2 IMPLANT
TUBE FEEDING 5FR 15 INCH (TUBING) IMPLANT
UNDERPAD 30X30 INCONTINENT (UNDERPADS AND DIAPERS) ×2 IMPLANT
WATER STERILE IRR 1000ML POUR (IV SOLUTION) ×2 IMPLANT
YANKAUER SUCT BULB TIP NO VENT (SUCTIONS) ×2 IMPLANT

## 2013-02-09 NOTE — Anesthesia Procedure Notes (Signed)
Procedure Name: LMA Insertion Date/Time: 02/09/2013 7:36 AM Performed by: Arlice Colt B Pre-anesthesia Checklist: Patient identified, Emergency Drugs available, Suction available, Patient being monitored and Timeout performed Patient Re-evaluated:Patient Re-evaluated prior to inductionOxygen Delivery Method: Circle system utilized Preoxygenation: Pre-oxygenation with 100% oxygen Intubation Type: IV induction LMA: LMA inserted LMA Size: 3.0 Number of attempts: 1 Placement Confirmation: positive ETCO2 and breath sounds checked- equal and bilateral Dental Injury: Teeth and Oropharynx as per pre-operative assessment

## 2013-02-09 NOTE — Transfer of Care (Signed)
Immediate Anesthesia Transfer of Care Note  Patient: Janet Frazier  Procedure(s) Performed: Procedure(s): HARDWARE REMOVAL, IRRIGATION AND DEBRIDEMENT LEFT ELBOW (Left)  Patient Location: PACU  Anesthesia Type:General  Level of Consciousness: awake, alert  and patient cooperative  Airway & Oxygen Therapy: Patient Spontanous Breathing and Patient connected to nasal cannula oxygen  Post-op Assessment: Report given to PACU RN, Post -op Vital signs reviewed and stable and Patient moving all extremities  Post vital signs: Reviewed and stable  Complications: No apparent anesthesia complications

## 2013-02-09 NOTE — Brief Op Note (Signed)
   Brief Op Note  Date of Surgery: 02/09/2013  Preoperative Diagnosis: Left elbow exposed hardware  Postoperative Diagnosis: same  Procedure: Procedure(s): HARDWARE REMOVAL, IRRIGATION AND DEBRIDEMENT LEFT ELBOW  Implants: removed  Surgeons: Surgeon(s): Cidney Kirkwood Glee Arvin, MD  Anesthesia: General  Drains: none  Estimated Blood Loss: See anesthesia record  Complications: None  Condition to PACU: Stable  Tailor Lucking Glee Arvin, MD [facility] 02/09/2013 8:49 AM

## 2013-02-09 NOTE — Progress Notes (Signed)
Dr. Gypsy Balsam notified of ISTAT glucose orders recieved

## 2013-02-09 NOTE — Anesthesia Postprocedure Evaluation (Signed)
  Anesthesia Post-op Note  Patient: Janet Frazier  Procedure(s) Performed: Procedure(s): HARDWARE REMOVAL, IRRIGATION AND DEBRIDEMENT LEFT ELBOW (Left)  Patient Location: PACU  Anesthesia Type:General  Level of Consciousness: awake and alert   Airway and Oxygen Therapy: Patient Spontanous Breathing  Post-op Pain: mild  Post-op Assessment: Post-op Vital signs reviewed, Patient's Cardiovascular Status Stable, Respiratory Function Stable, Patent Airway, No signs of Nausea or vomiting and Pain level controlled  Post-op Vital Signs: Reviewed and stable  Complications: No apparent anesthesia complications

## 2013-02-09 NOTE — Anesthesia Preprocedure Evaluation (Addendum)
Anesthesia Evaluation  Patient identified by MRN, date of birth, ID band Patient confused    Reviewed: Allergy & Precautions, H&P , NPO status , Patient's Chart, lab work & pertinent test results  History of Anesthesia Complications Negative for: history of anesthetic complications  Airway Mallampati: II TM Distance: >3 FB Neck ROM: Full    Dental  (+) Teeth Intact and Dental Advisory Given   Pulmonary former smoker,  breath sounds clear to auscultation        Cardiovascular hypertension, Pt. on medications Rhythm:Regular Rate:Normal     Neuro/Psych TIA   GI/Hepatic GERD-  ,  Endo/Other  diabetes, Type 2, Insulin Dependent  Renal/GU      Musculoskeletal   Abdominal   Peds  Hematology   Anesthesia Other Findings   Reproductive/Obstetrics                         Anesthesia Physical Anesthesia Plan  ASA: III  Anesthesia Plan: General   Post-op Pain Management:    Induction: Intravenous  Airway Management Planned: LMA  Additional Equipment:   Intra-op Plan:   Post-operative Plan: Extubation in OR  Informed Consent: I have reviewed the patients History and Physical, chart, labs and discussed the procedure including the risks, benefits and alternatives for the proposed anesthesia with the patient or authorized representative who has indicated his/her understanding and acceptance.   Dental advisory given  Plan Discussed with: Surgeon and CRNA  Anesthesia Plan Comments:        Anesthesia Quick Evaluation

## 2013-02-09 NOTE — H&P (Signed)
H&P update  The surgical history has been reviewed and remains accurate without interval change.  The patient was re-examined and patient's physiologic condition has not changed significantly in the last 30 days. The condition still exists that makes this procedure necessary. The treatment plan remains the same, without new options for care.  No new pharmacological allergies or types of therapy has been initiated that would change the plan or the appropriateness of the plan.  The patient and/or family understand the potential benefits and risks.  Mayra Reel, MD 02/09/2013 6:53 AM

## 2013-02-09 NOTE — Op Note (Signed)
Date of surgery: 02/09/2013  Preoperative diagnosis: Exposed hardware of left elbow  Postoperative diagnosis: Same  Procedure: 1. Debridement of bone skin muscle and subcutaneous tissue of left elbow. 2. Removal of deep implant. 3. Adjacent tissue rearrangement less than 10 cm of left elbow.  Surgeon: Glee Arvin, M.D.  Anesthesia: Gen.  Estimated blood loss: Minimal  Complications: None  Condition to PACU: Stable  Indications for procedure: Janet Frazier is a 80 year old female who underwent open reduction internal fixation of her left elbow fracture 10 years ago in [place].  She presented to my clinic a couple of days after she noticed the hardware was exposed through the skin. She denies any fevers, chills, night sweats or any other signs or symptoms of sepsis. The recommendation was for deep implant removal and skin closure. She presents today for the procedure. The risks, benefits, and alternatives to surgery were discussed with the patient and the daughter and they elected to proceed with surgery.  Description of procedure: The patient was identified in the holding area. The operative site and procedure were confirmed with the patient and the surgeon. She was transported back to the operating room. She was placed supine on the table. General anesthesia was induced. A nonsterile tourniquet was placed on the upper left arm. Left upper extremity was prepped and draped in standard sterile fashion. A timeout was performed. Pre-incisional antibiotics were given. The tourniquet was inflated to 250 mmHg.  The incision was made over the previous incision. The exposed wound was ellipsed out. Blunt dissection was taken down to the level of the plate. Full-thickness flaps were raised off of the plate. The material that had been formed over the plate was sharply elevated off of the plate. The entire length of the plate was exposed. The screws were sequentially backed out. The plate was elevated off  of the bone with the use of a Therapist, nutritional. Sharp debridement of the bone, subcutaneous tissue, muscle was then carried out with a knife and rongeur. 3 L of normal saline was irrigated through the wound using pulse lavage. The tourniquet was deflated. All skin edges bled normally. Hemostasis was obtained. The wound was closed in a layer fashion with 2-0 Monocryl for the fascia 2-0 Monocryl for the subcutaneous layer and 3-0 nylon for the skin. A sterile dressing was applied at the end. The patient awoke from anesthesia uneventfully and transferred to the PACU in stable condition.  Disposition: The patient will be weightbearing as tolerated to the left elbow. She will be discharged home with 2 weeks of antibiotics. We will see her back in the clinic in 2 weeks.  Mayra Reel, MD [facility] [phone] 8:58 AM

## 2013-02-10 ENCOUNTER — Encounter (HOSPITAL_COMMUNITY): Payer: Self-pay | Admitting: Orthopaedic Surgery

## 2013-03-16 ENCOUNTER — Ambulatory Visit (INDEPENDENT_AMBULATORY_CARE_PROVIDER_SITE_OTHER): Payer: Medicare Other | Admitting: Ophthalmology

## 2013-03-24 ENCOUNTER — Encounter (HOSPITAL_COMMUNITY): Payer: Self-pay | Admitting: Emergency Medicine

## 2013-03-24 ENCOUNTER — Inpatient Hospital Stay (HOSPITAL_COMMUNITY)
Admission: EM | Admit: 2013-03-24 | Discharge: 2013-03-28 | DRG: 481 | Disposition: A | Discharge status: Skilled Nursing Facility | Payer: Medicare Other | Attending: Internal Medicine | Admitting: Internal Medicine

## 2013-03-24 ENCOUNTER — Emergency Department (HOSPITAL_COMMUNITY): Payer: Medicare Other

## 2013-03-24 DIAGNOSIS — Z794 Long term (current) use of insulin: Secondary | ICD-10-CM

## 2013-03-24 DIAGNOSIS — Z87891 Personal history of nicotine dependence: Secondary | ICD-10-CM

## 2013-03-24 DIAGNOSIS — S72143A Displaced intertrochanteric fracture of unspecified femur, initial encounter for closed fracture: Principal | ICD-10-CM | POA: Diagnosis present

## 2013-03-24 DIAGNOSIS — S52501A Unspecified fracture of the lower end of right radius, initial encounter for closed fracture: Secondary | ICD-10-CM

## 2013-03-24 DIAGNOSIS — N390 Urinary tract infection, site not specified: Secondary | ICD-10-CM | POA: Diagnosis present

## 2013-03-24 DIAGNOSIS — M129 Arthropathy, unspecified: Secondary | ICD-10-CM | POA: Diagnosis present

## 2013-03-24 DIAGNOSIS — I10 Essential (primary) hypertension: Secondary | ICD-10-CM | POA: Diagnosis present

## 2013-03-24 DIAGNOSIS — K219 Gastro-esophageal reflux disease without esophagitis: Secondary | ICD-10-CM | POA: Diagnosis present

## 2013-03-24 DIAGNOSIS — S52611A Displaced fracture of right ulna styloid process, initial encounter for closed fracture: Secondary | ICD-10-CM

## 2013-03-24 DIAGNOSIS — R296 Repeated falls: Secondary | ICD-10-CM

## 2013-03-24 DIAGNOSIS — E119 Type 2 diabetes mellitus without complications: Secondary | ICD-10-CM | POA: Diagnosis present

## 2013-03-24 DIAGNOSIS — W19XXXA Unspecified fall, initial encounter: Secondary | ICD-10-CM

## 2013-03-24 DIAGNOSIS — S72141A Displaced intertrochanteric fracture of right femur, initial encounter for closed fracture: Secondary | ICD-10-CM | POA: Diagnosis present

## 2013-03-24 DIAGNOSIS — Z66 Do not resuscitate: Secondary | ICD-10-CM | POA: Diagnosis present

## 2013-03-24 DIAGNOSIS — M549 Dorsalgia, unspecified: Secondary | ICD-10-CM

## 2013-03-24 DIAGNOSIS — W010XXA Fall on same level from slipping, tripping and stumbling without subsequent striking against object, initial encounter: Secondary | ICD-10-CM | POA: Diagnosis present

## 2013-03-24 DIAGNOSIS — D649 Anemia, unspecified: Secondary | ICD-10-CM | POA: Diagnosis present

## 2013-03-24 DIAGNOSIS — E876 Hypokalemia: Secondary | ICD-10-CM

## 2013-03-24 DIAGNOSIS — S52599A Other fractures of lower end of unspecified radius, initial encounter for closed fracture: Secondary | ICD-10-CM | POA: Diagnosis present

## 2013-03-24 DIAGNOSIS — M810 Age-related osteoporosis without current pathological fracture: Secondary | ICD-10-CM | POA: Diagnosis present

## 2013-03-24 DIAGNOSIS — S72009A Fracture of unspecified part of neck of unspecified femur, initial encounter for closed fracture: Secondary | ICD-10-CM | POA: Diagnosis present

## 2013-03-24 DIAGNOSIS — S72001A Fracture of unspecified part of neck of right femur, initial encounter for closed fracture: Secondary | ICD-10-CM | POA: Diagnosis present

## 2013-03-24 DIAGNOSIS — Y92009 Unspecified place in unspecified non-institutional (private) residence as the place of occurrence of the external cause: Secondary | ICD-10-CM

## 2013-03-24 DIAGNOSIS — Z7982 Long term (current) use of aspirin: Secondary | ICD-10-CM

## 2013-03-24 LAB — CBC WITH DIFFERENTIAL/PLATELET
Basophils Absolute: 0 10*3/uL (ref 0.0–0.1)
Basophils Relative: 0 % (ref 0–1)
EOS ABS: 0.1 10*3/uL (ref 0.0–0.7)
EOS PCT: 0 % (ref 0–5)
HCT: 34.4 % — ABNORMAL LOW (ref 36.0–46.0)
HEMOGLOBIN: 11.6 g/dL — AB (ref 12.0–15.0)
LYMPHS ABS: 0.8 10*3/uL (ref 0.7–4.0)
Lymphocytes Relative: 5 % — ABNORMAL LOW (ref 12–46)
MCH: 29.1 pg (ref 26.0–34.0)
MCHC: 33.7 g/dL (ref 30.0–36.0)
MCV: 86.2 fL (ref 78.0–100.0)
MONO ABS: 0.9 10*3/uL (ref 0.1–1.0)
MONOS PCT: 6 % (ref 3–12)
NEUTROS PCT: 88 % — AB (ref 43–77)
Neutro Abs: 13.4 10*3/uL — ABNORMAL HIGH (ref 1.7–7.7)
Platelets: 264 10*3/uL (ref 150–400)
RBC: 3.99 MIL/uL (ref 3.87–5.11)
RDW: 13.3 % (ref 11.5–15.5)
WBC: 15.1 10*3/uL — ABNORMAL HIGH (ref 4.0–10.5)

## 2013-03-24 LAB — URINALYSIS, ROUTINE W REFLEX MICROSCOPIC
BILIRUBIN URINE: NEGATIVE
Glucose, UA: 100 mg/dL — AB
HGB URINE DIPSTICK: NEGATIVE
Ketones, ur: 15 mg/dL — AB
Nitrite: NEGATIVE
PROTEIN: NEGATIVE mg/dL
Specific Gravity, Urine: 1.016 (ref 1.005–1.030)
UROBILINOGEN UA: 0.2 mg/dL (ref 0.0–1.0)
pH: 5 (ref 5.0–8.0)

## 2013-03-24 LAB — BASIC METABOLIC PANEL
BUN: 20 mg/dL (ref 6–23)
CHLORIDE: 100 meq/L (ref 96–112)
CO2: 27 mEq/L (ref 19–32)
Calcium: 8.2 mg/dL — ABNORMAL LOW (ref 8.4–10.5)
Creatinine, Ser: 0.56 mg/dL (ref 0.50–1.10)
GFR calc Af Amer: >90 mL/min (ref >90)
GFR calc non Af Amer: 86 mL/min — ABNORMAL LOW (ref >90)
GLUCOSE: 254 mg/dL — AB (ref 70–99)
POTASSIUM: 4 meq/L (ref 3.7–5.3)
SODIUM: 138 meq/L (ref 137–147)

## 2013-03-24 LAB — ABO/RH: ABO/RH(D): B POS

## 2013-03-24 LAB — PROTIME-INR
INR: 0.98 (ref 0.00–1.49)
Prothrombin Time: 12.8 seconds (ref 11.6–15.2)

## 2013-03-24 LAB — URINE MICROSCOPIC-ADD ON

## 2013-03-24 LAB — GLUCOSE, CAPILLARY: Glucose-Capillary: 247 mg/dL — ABNORMAL HIGH (ref 70–99)

## 2013-03-24 MED ORDER — SODIUM CHLORIDE 0.9 % IV SOLN
1000.0000 mL | INTRAVENOUS | Status: DC
  Administered 2013-03-24: 1000 mL via INTRAVENOUS

## 2013-03-24 MED ORDER — MORPHINE SULFATE 2 MG/ML IJ SOLN: 0.5000 mg | INTRAMUSCULAR | Status: DC | PRN

## 2013-03-24 MED ORDER — TRAMADOL HCL 50 MG PO TABS
50.0000 mg | ORAL_TABLET | Freq: Every day | ORAL | Status: DC | PRN
  Administered 2013-03-28: 50 mg via ORAL
  Filled 2013-03-24: qty 1

## 2013-03-24 MED ORDER — FERROUS FUMARATE 325 (106 FE) MG PO TABS
1.0000 | ORAL_TABLET | ORAL | Status: DC
  Administered 2013-03-25 – 2013-03-27 (×2): 106 mg via ORAL
  Filled 2013-03-24 (×2): qty 1

## 2013-03-24 MED ORDER — PROCHLORPERAZINE MALEATE 5 MG PO TABS
5.0000 mg | ORAL_TABLET | Freq: Two times a day (BID) | ORAL | Status: DC | PRN
  Filled 2013-03-24: qty 1

## 2013-03-24 MED ORDER — MORPHINE SULFATE 2 MG/ML IJ SOLN
0.5000 mg | INTRAMUSCULAR | Status: DC | PRN
Start: 2013-03-24 — End: 2013-03-24

## 2013-03-24 MED ORDER — ASPIRIN EC 325 MG PO TBEC: 325.0000 mg | DELAYED_RELEASE_TABLET | Freq: Every day | ORAL | Status: DC

## 2013-03-24 MED ORDER — HYDROCODONE-ACETAMINOPHEN 5-325 MG PO TABS
1.0000 | ORAL_TABLET | Freq: Four times a day (QID) | ORAL | Status: DC | PRN
  Administered 2013-03-24 – 2013-03-26 (×2): 2 via ORAL
  Filled 2013-03-24 (×2): qty 2

## 2013-03-24 MED ORDER — INSULIN GLARGINE 100 UNIT/ML ~~LOC~~ SOLN
18.0000 [IU] | Freq: Every day | SUBCUTANEOUS | Status: DC
  Administered 2013-03-25 – 2013-03-26 (×2): 18 [IU] via SUBCUTANEOUS
  Filled 2013-03-24 (×2): qty 0.18

## 2013-03-24 MED ORDER — PANTOPRAZOLE SODIUM 40 MG PO TBEC
40.0000 mg | DELAYED_RELEASE_TABLET | Freq: Every day | ORAL | Status: DC
  Administered 2013-03-26 – 2013-03-28 (×3): 40 mg via ORAL
  Filled 2013-03-24 (×3): qty 1

## 2013-03-24 MED ORDER — SODIUM CHLORIDE 0.9 % IV SOLN
INTRAVENOUS | Status: AC
  Administered 2013-03-24: 20 mL/h via INTRAVENOUS

## 2013-03-24 MED ORDER — HYDROCODONE-ACETAMINOPHEN 5-325 MG PO TABS: 1.0000 | ORAL_TABLET | Freq: Four times a day (QID) | ORAL | Status: DC | PRN

## 2013-03-24 MED ORDER — VITAMIN D3 25 MCG (1000 UNIT) PO TABS
2000.0000 [IU] | ORAL_TABLET | Freq: Every day | ORAL | Status: DC
  Administered 2013-03-25 – 2013-03-28 (×4): 2000 [IU] via ORAL
  Filled 2013-03-24 (×4): qty 2

## 2013-03-24 MED ORDER — AMLODIPINE BESYLATE 5 MG PO TABS
5.0000 mg | ORAL_TABLET | Freq: Every day | ORAL | Status: DC
  Administered 2013-03-25 – 2013-03-28 (×4): 5 mg via ORAL
  Filled 2013-03-24 (×4): qty 1

## 2013-03-24 MED ORDER — ATENOLOL 25 MG PO TABS
25.0000 mg | ORAL_TABLET | Freq: Every day | ORAL | Status: DC
  Administered 2013-03-25 – 2013-03-27 (×3): 25 mg via ORAL
  Filled 2013-03-24 (×4): qty 1

## 2013-03-24 MED ORDER — INSULIN ASPART 100 UNIT/ML ~~LOC~~ SOLN
2.0000 [IU] | Freq: Once | SUBCUTANEOUS | Status: AC
  Administered 2013-03-24: 2 [IU] via SUBCUTANEOUS

## 2013-03-24 NOTE — ED Notes (Signed)
Pt is not in room. Unable to do an EKG and foley at this time. Will complete when pt returns to room.

## 2013-03-24 NOTE — ED Notes (Signed)
Pt family asked if we could check her sugar.

## 2013-03-24 NOTE — ED Provider Notes (Signed)
TIME SEEN: 5:51 PM  CHIEF COMPLAINT: Fall, right hip, right shoulder, right wrist pain  HPI: Patient is an 80 year old female with a history of diabetes, hypertension who lives at home he reports this afternoon when doing laundry she turned quickly and tripped over the basket falling onto her back and right side. She denies hitting her head. No loss of consciousness. She's not on anticoagulation. She was not able to get up off the floor and ambulate after the fall. She's complaining of right shoulder, right hip and right wrist pain. No numbness, tingling or focal weakness. No chest pain, shortness of breath, palpitations or dizziness that caused her to fall. No recent fever, cough, vomiting or diarrhea.  ROS: See HPI Constitutional: no fever  Eyes: no drainage  ENT: no runny nose   Cardiovascular:  no chest pain  Resp: no SOB  GI: no vomiting GU: no dysuria Integumentary: no rash  Allergy: no hives  Musculoskeletal: no leg swelling  Neurological: no slurred speech ROS otherwise negative  PAST MEDICAL HISTORY/PAST SURGICAL HISTORY:  Past Medical History  Diagnosis Date  . Osteoporosis   . Diabetes mellitus without complication   . GERD (gastroesophageal reflux disease)   . Back pain   . Hypertension   . Arthritis   . Hepatitis     as a child  . Crohn's disease     MEDICATIONS:  Prior to Admission medications   Medication Sig Start Date End Date Taking? Authorizing Provider  amLODipine (NORVASC) 5 MG tablet Take 2 tablets (10 mg total) by mouth daily. 11/14/12   Zannie Cove, MD  aspirin EC 81 MG tablet Take 81 mg by mouth every other day.     Historical Provider, MD  atenolol (TENORMIN) 25 MG tablet Take 25 mg by mouth daily.    Historical Provider, MD  calcium carbonate (OS-CAL) 600 MG TABS tablet Take 600 mg by mouth daily with breakfast.    Historical Provider, MD  Cholecalciferol (VITAMIN D3) 2000 UNITS TABS Take 1 tablet by mouth daily.    Historical Provider, MD   cyclobenzaprine (FLEXERIL) 10 MG tablet Take 5 mg by mouth 3 (three) times daily as needed for muscle spasms (pai).     Historical Provider, MD  denosumab (PROLIA) 60 MG/ML SOLN injection Inject 60 mg into the skin every 6 (six) months. Administer in upper arm, thigh, or abdomen    Historical Provider, MD  ferrous fumarate (HEMOCYTE - 106 MG FE) 325 (106 FE) MG TABS Take 1 tablet by mouth every other day.    Historical Provider, MD  furosemide (LASIX) 20 MG tablet Take 20 mg by mouth every other day.    Historical Provider, MD  HYDROcodone-acetaminophen (NORCO) 5-325 MG per tablet Take 1-2 tablets by mouth every 6 (six) hours as needed. 02/09/13   Naiping Glee Arvin, MD  ibuprofen (ADVIL,MOTRIN) 200 MG tablet Take 2 tablets (400 mg total) by mouth every 8 (eight) hours as needed for pain (with meals preferably if possible). For 1 week 11/14/12   Zannie Cove, MD  insulin aspart (NOVOLOG) 100 UNIT/ML injection Inject 4-6 Units into the skin 3 (three) times daily with meals. 5units in am, 4units at lunch, and 6units in pm    Historical Provider, MD  insulin glargine (LANTUS) 100 UNIT/ML injection Inject 0.15 mLs (15 Units total) into the skin daily. Takes in the am 11/14/12   Zannie Cove, MD  lansoprazole (PREVACID) 30 MG capsule Take 30 mg by mouth daily.    Historical Provider,  MD  neomycin-polymyxin-pramoxine (NEOSPORIN PLUS) 1 % cream Apply 1 application topically 2 (two) times daily as needed (applied to wounds).     Historical Provider, MD  prochlorperazine (COMPAZINE) 5 MG tablet Take 5 mg by mouth every 6 (six) hours as needed for nausea.     Historical Provider, MD  sulfamethoxazole-trimethoprim (BACTRIM DS) 800-160 MG per tablet Take 1 tablet by mouth 2 (two) times daily. 02/09/13   Naiping Glee Arvin, MD  traMADol Janean Sark) 50 MG tablet Take one tablet by mouth every 8 hours for pain 11/28/12   Kermit Balo, DO    ALLERGIES:  No Known Allergies  SOCIAL HISTORY:  History  Substance Use  Topics  . Smoking status: Former Games developer  . Smokeless tobacco: Never Used  . Alcohol Use: No    FAMILY HISTORY: No family history on file.  EXAM: BP 130/43  Pulse 61  Temp(Src) 98.1 F (36.7 C) (Oral)  Resp 17  SpO2 97% CONSTITUTIONAL: Alert and oriented and responds appropriately to questions. Well-appearing; well-nourished; GCS 15 HEAD: Normocephalic; atraumatic EYES: Conjunctivae clear, PERRL, EOMI ENT: normal nose; no rhinorrhea; moist mucous membranes; pharynx without lesions noted; no dental injury;  no septal hematoma NECK: Supple, no meningismus, no LAD; no midline spinal tenderness, step-off or deformity CARD: RRR; S1 and S2 appreciated; no murmurs, no clicks, no rubs, no gallops RESP: Normal chest excursion without splinting or tachypnea; breath sounds clear and equal bilaterally; no wheezes, no rhonchi, no rales; chest wall stable, nontender to palpation ABD/GI: Normal bowel sounds; non-distended; soft, non-tender, no rebound, no guarding PELVIS:  stable, nontender to palpation BACK:  The back appears normal and is non-tender to palpation, there is no CVA tenderness; no midline spinal tenderness, step-off or deformity EXT: Right lower extremity is shortened and externally rotated, tender to palpation over the right lateral hip, swelling and tenderness to palpation diffusely over the right wrist, tenderness to palpation over the anterior right shoulder without any signs of dislocation, 2+ radial pulses bilaterally, sensation to light touch intact diffusely, normal range of motion in the right shoulder, decreased flexion and extension of the right wrist secondary to pain, decreased movement in the right hip secondary to pain, otherwise Normal ROM in all other joints; non-tender to palpation; no edema; normal capillary refill; no cyanosis    SKIN: Normal color for age and race; warm NEURO: Moves all extremities equally, cranial nerves II through XII intact, sensation to light touch  intact diffuse PSYCH: The patient's mood and manner are appropriate. Grooming and personal hygiene are appropriate.  MEDICAL DECISION MAKING: Patient here with mechanical fall. She is hemodynamically stable. Suspect right hip fracture. We'll x-ray right shoulder, right wrist, right hip. Will obtain labs, chest x-ray, urine and EKG for preop. Patient's PCP is Dr. Thea Silversmith with [facility] She does not have an orthopedist.  ED PROGRESS: Patient's labs show leukocytosis which is likely reactive. She is hyperglycemic with a glucose of 254. X-rays pending.   8:16 PM  Pt has right intertrochanteric fracture.  She also appears to have a comminuted right distal radius fracture with dorsal displacement. She is neurovascularly intact distally in both extremities. Patient has been seen by Dr. Roda Shutters in December 2014 for left elbow debridement. Will discuss with on-call physician for [facility] orthopedics.   8:22 PM  Spoke with Dr. Otelia Sergeant with orthopedics who will see the patient in the emergency department. Will discuss with hospitalist for admission. Patient updated on plan.  Patient last ate this morning. She  has been n.p.o. in the emergency department.  8:53 PM  Spoke with Dr. Toniann FailKakrakandy, hospitalist for admission. EKG shows no ischemic changes. Unchanged compared to prior.   EKG Interpretation    Date/Time:  Tuesday March 24 2013 20:46:10 EST Ventricular Rate:  61 PR Interval:  142 QRS Duration: 78 QT Interval:  446 QTC Calculation: 449 R Axis:   106 Text Interpretation:  Sinus rhythm Consider left atrial enlargement Right axis deviation No significant change since last tracing Confirmed by WARD  DO, KRISTEN ([id number]) on 03/24/2013 8:53:19 PM             Layla MawKristen N Ward, DO 03/24/13 2054

## 2013-03-24 NOTE — Progress Notes (Signed)
Orthopedic Tech Progress Note Patient Details:  Janet Frazier 08/21/1930 [id number]  Ortho Devices Type of Ortho Device: Ace wrap;Sugartong splint Ortho Device/Splint Location: RUE Ortho Device/Splint Interventions: Ordered;Application   [name], Oron Westrup Craig 03/24/2013, 10:10 PM

## 2013-03-24 NOTE — Consult Note (Signed)
Reason for Consult:Right Hip and Right Wrist Fractures. Referring Physician: Dr. Leonides Schanz. Consulting Physician:Sharlie Shreffler E  Orthopedic Diagnosis: Right three-part closed intertrochanteric hip fracture displaced varus angulation. Right closed comminuted distal radius fracture intra-articular with dorsal displacement and impaction associated ulnar styloid fracture. Generalized osteoporosis with history of multiple dorsal lumbar compression fractures in the past. History of previous left elbow fracture status post removal of plate by Dr. Erlinda Hong. History of diabetes mellitus. History of Crohn's disease. History of GERD. Previous history of knee surgery and left elbow surgery.  [id number] Janet Frazier is an 80 y.o. female. Reports having a fall this evening after washing close she reportedly fell landing on her right side sustaining injury to the right hip into the right wrist. She was brought to the emergency room via ambulance x-rays demonstrated a closed right comminuted three-part intertrochanteric hip fracture. She also has a comminuted right distal radius fracture with dorsal displacement and impaction of the fracture site. Discussion was made regarding the right wrist fracture site and her right hip fracture noting the present operating room schedule. The hand specialist did not feel comfortable performing of said procedure of the right wrist this evening during early hours and recommended that this be done in on a scheduled basis. We'll plan then to admit Janet Frazier and consult Dr. Erlinda Hong to assist in her right wrist surgery while undergoing right hip surgery under the same anesthetic.   Past Medical History  Diagnosis Date  . Osteoporosis   . Diabetes mellitus without complication   . GERD (gastroesophageal reflux disease)   . Back pain   . Hypertension   . Arthritis   . Hepatitis     as a child  . Crohn's disease     Past Surgical History  Procedure Laterality Date  . Knee surgery Right   . Elbow  surgery Left   . Abdominal surgery    . Eye surgery      bilateral cataracts  . Colonoscopy    . Hardware removal Left 02/09/2013    Procedure: HARDWARE REMOVAL, IRRIGATION AND DEBRIDEMENT LEFT ELBOW;  Surgeon: Marianna Payment, MD;  Location: [facility];  Service: Orthopedics;  Laterality: Left;    No family history on file.  Social History:  reports that she has quit smoking. She has never used smokeless tobacco. She reports that she does not drink alcohol or use illicit drugs.  Allergies: No Known Allergies  Medications:  Prior to Admission:  Prescriptions prior to admission  Medication Sig Dispense Refill  . amLODipine (NORVASC) 5 MG tablet Take 5 mg by mouth daily.      Marland Kitchen aspirin EC 81 MG tablet Take 81 mg by mouth every other day.       Marland Kitchen atenolol (TENORMIN) 25 MG tablet Take 25 mg by mouth daily.      . Cholecalciferol (VITAMIN D3) 2000 UNITS TABS Take 2,000 Units by mouth daily.       Marland Kitchen denosumab (PROLIA) 60 MG/ML SOLN injection Inject 60 mg into the skin every 6 (six) months. Administer in upper arm, thigh, or abdomen (last injection April 2014)      . ferrous fumarate (HEMOCYTE - 106 MG FE) 325 (106 FE) MG TABS Take 1 tablet by mouth every other day.      . furosemide (LASIX) 20 MG tablet Take 20 mg by mouth every other day.      . ibuprofen (ADVIL,MOTRIN) 200 MG tablet Take 200 mg by mouth daily as needed (pain).      Marland Kitchen  insulin aspart (NOVOLOG) 100 UNIT/ML injection Inject 0-6 Units into the skin 3 (three) times daily with meals. Based on sliding scale      . insulin glargine (LANTUS) 100 UNIT/ML injection Inject 18 Units into the skin daily before breakfast.      . lansoprazole (PREVACID) 30 MG capsule Take 30 mg by mouth daily.      Marland Kitchen neomycin-polymyxin-pramoxine (NEOSPORIN PLUS) 1 % cream Apply 1 application topically 2 (two) times daily as needed (applied to wounds).       . prochlorperazine (COMPAZINE) 5 MG tablet Take 5 mg by mouth 2 (two) times daily as needed for nausea.        . traMADol (ULTRAM) 50 MG tablet Take 50 mg by mouth daily as needed (pain).       Scheduled: . [START ON 03/25/2013] aspirin EC  325 mg Oral Daily  . insulin aspart  2 Units Subcutaneous Once   [id number], morphine injection  Results for orders placed during the hospital encounter of 03/24/13 (from the past 48 hour(s))  BASIC METABOLIC PANEL     Status: Abnormal   Collection Time    03/24/13  6:08 PM      Result Value Range   Sodium 138  137 - 147 mEq/L   Potassium 4.0  3.7 - 5.3 mEq/L   Chloride 100  96 - 112 mEq/L   CO2 27  19 - 32 mEq/L   Glucose, Bld 254 (*) 70 - 99 mg/dL   BUN 20  6 - 23 mg/dL   Creatinine, Ser 0.56  0.50 - 1.10 mg/dL   Calcium 8.2 (*) 8.4 - 10.5 mg/dL   GFR calc non Af Amer 86 (*) >90 mL/min   GFR calc Af Amer >90  >90 mL/min   Comment: (NOTE)     The eGFR has been calculated using the CKD EPI equation.     This calculation has not been validated in all clinical situations.     eGFR's persistently <90 mL/min signify possible Chronic Kidney     Disease.  CBC WITH DIFFERENTIAL     Status: Abnormal   Collection Time    03/24/13  6:08 PM      Result Value Range   WBC 15.1 (*) 4.0 - 10.5 K/uL   RBC 3.99  3.87 - 5.11 MIL/uL   Hemoglobin 11.6 (*) 12.0 - 15.0 g/dL   HCT 34.4 (*) 36.0 - 46.0 %   MCV 86.2  78.0 - 100.0 fL   MCH 29.1  26.0 - 34.0 pg   MCHC 33.7  30.0 - 36.0 g/dL   RDW 13.3  11.5 - 15.5 %   Platelets 264  150 - 400 K/uL   Neutrophils Relative % 88 (*) 43 - 77 %   Neutro Abs 13.4 (*) 1.7 - 7.7 K/uL   Lymphocytes Relative 5 (*) 12 - 46 %   Lymphs Abs 0.8  0.7 - 4.0 K/uL   Monocytes Relative 6  3 - 12 %   Monocytes Absolute 0.9  0.1 - 1.0 K/uL   Eosinophils Relative 0  0 - 5 %   Eosinophils Absolute 0.1  0.0 - 0.7 K/uL   Basophils Relative 0  0 - 1 %   Basophils Absolute 0.0  0.0 - 0.1 K/uL  PROTIME-INR     Status: None   Collection Time    03/24/13  6:57 PM      Result Value Range   Prothrombin Time 12.8  11.6  -  15.2 seconds   INR 0.98  0.00 - 1.49  TYPE AND SCREEN     Status: None   Collection Time    03/24/13  8:10 PM      Result Value Range   ABO/RH(D) B POS     Antibody Screen NEG     Sample Expiration 03/27/2013    ABO/RH     Status: None   Collection Time    03/24/13  8:10 PM      Result Value Range   ABO/RH(D) B POS    URINALYSIS, ROUTINE W REFLEX MICROSCOPIC     Status: Abnormal   Collection Time    03/24/13  8:43 PM      Result Value Range   Color, Urine YELLOW  YELLOW   APPearance CLOUDY (*) CLEAR   Specific Gravity, Urine 1.016  1.005 - 1.030   pH 5.0  5.0 - 8.0   Glucose, UA 100 (*) NEGATIVE mg/dL   Hgb urine dipstick NEGATIVE  NEGATIVE   Bilirubin Urine NEGATIVE  NEGATIVE   Ketones, ur 15 (*) NEGATIVE mg/dL   Protein, ur NEGATIVE  NEGATIVE mg/dL   Urobilinogen, UA 0.2  0.0 - 1.0 mg/dL   Nitrite NEGATIVE  NEGATIVE   Leukocytes, UA TRACE (*) NEGATIVE  URINE MICROSCOPIC-ADD ON     Status: None   Collection Time    03/24/13  8:43 PM      Result Value Range   Squamous Epithelial / LPF RARE  RARE   WBC, UA 0-2  <3 WBC/hpf   RBC / HPF 0-2  <3 RBC/hpf   Bacteria, UA RARE  RARE   Urine-Other AMORPHOUS URATES/PHOSPHATES      Dg Chest 1 View  03/24/2013   CLINICAL DATA:  Fall.  Wrist fracture. Pre-op respiratory exam.  EXAM: CHEST - 1 VIEW  COMPARISON:  11/12/2012  FINDINGS: Low lung volumes again noted. Mild cardiomegaly is stable. No evidence of acute infiltrate or edema. Mild elevation of left hemidiaphragm and left basilar scarring is unchanged. Old left posterior rib fractures again noted.  IMPRESSION: No acute findings.   Electronically Signed   By: Earle Gell M.D.   On: 03/24/2013 20:20   Dg Shoulder Right  03/24/2013   CLINICAL DATA:  Fall.  Right shoulder injury and pain.  EXAM: RIGHT SHOULDER - 2+ VIEW  COMPARISON:  None.  FINDINGS: There is no evidence of fracture or dislocation. Diffuse osteopenia noted. No focal bone lesions identified. Mild soft tissue  calcification noted.  IMPRESSION: No acute findings.  Osteopenia.   Electronically Signed   By: Earle Gell M.D.   On: 03/24/2013 20:21   Dg Wrist Complete Right  03/24/2013   CLINICAL DATA:  Fall.  Right wrist pain and fracture deformity pre  EXAM: RIGHT WRIST - COMPLETE 3+ VIEW  COMPARISON:  None.  FINDINGS: Diffuse osteopenia noted. Highly comminuted and impacted fracture of the distal radius seen, with involvement of both the distal radial ulnar and radiocarpal joints. Dorsal displacement and angulation of the distal articular surface of the radius is seen. Associated fracture of the ulnar styloid process also noted. Carpal bones remain normal in alignment.  IMPRESSION: Highly comminuted and impacted fracture of the distal radius, with dorsal displacement angulation.  Ulnar styloid process fracture.  Osteopenia.   Electronically Signed   By: Earle Gell M.D.   On: 03/24/2013 20:19   Dg Hip Complete Right  03/24/2013   CLINICAL DATA:  Fall.  Right hip injury and pain.  EXAM: RIGHT HIP - COMPLETE 2+ VIEW  COMPARISON:  None.  FINDINGS: Intertrochanteric right hip fracture is seen with varus angulation. No evidence of hip dislocation.  Diffuse osteopenia noted. Old left pubic rami fractures demonstrated as well as peripheral vascular calcification.  IMPRESSION: Intertrochanteric right hip fracture.  Osteopenia.   Electronically Signed   By: Earle Gell M.D.   On: 03/24/2013 20:22    Review of Systems  Constitutional: Negative.   HENT: Negative.   Eyes: Negative.   Respiratory: Negative.   Cardiovascular: Negative.   Gastrointestinal: Negative.   Genitourinary: Negative.   Musculoskeletal: Negative.   Skin: Negative.   Neurological: Negative.   Endo/Heme/Allergies: Negative.   Psychiatric/Behavioral: Negative.    Blood pressure 133/46, pulse 66, temperature 98.1 F (36.7 C), temperature source Oral, resp. rate 17, SpO2 97.00%. Physical Exam  Orthopaedic Exam: 80 year old female appears  stated age. She is alert oriented x4 lying on stretcher right leg shortened and externally rotated right wrist with obvious bone shaped deformity. Right upper sham the shoulder was normal range of motion elbow without deformity normal range of motion right wrist with some very mild swelling dorsally and radial were there is dorsal displacement of the wrist on the distal forearm present. Pulses are intact and capillary refill is normal sensation in the right hand is normal. Right the leg with motor are intact sensation intact. Dorsalis pedis pulse and posterior tibialis pulses are normal right leg is externally rotated at the thigh and the right leg is shortened 1 inch. Radiographic findings as above.  Assessment/Plan:Right three-part closed intertrochanteric hip fracture displaced varus angulation. Right closed comminuted distal radius fracture intra-articular with dorsal displacement and impaction associated ulnar styloid fracture. Generalized osteoporosis with history of multiple dorsal lumbar compression fractures in the past. History of previous left elbow fracture status post removal of plate by Dr. Erlinda Hong. History of diabetes mellitus. History of Crohn's disease. History of GERD. Previous history of knee surgery and left elbow surgery. Plan: Discussion with on call hand specialists indicating that the wrist fracture can be treated on an elective basis. Unfortunately that time and is near midnight so that our plan will be to admit the patient treat her with medicines to relieve pain and plan to schedule her for surgery for her right hip and right wrist Wednesday, 03/25/2013. Plan would be to perform intramedullary nail fixation of the right intertrochanteric hip fracture. She likely will require open reduction internal fixation of the right distal radius fracture with bone grafting. A volar splint is applied to the right wrist by the orthopedic technician tonight.  Rawn Quiroa E 03/24/2013, 9:48 PM

## 2013-03-24 NOTE — Progress Notes (Signed)
Orthopedic Tech Progress Note Patient Details:  Ethelda Chickatricia M Parkhill 09/18/1930 [id number] Unable to use overhead frame  Ortho Devices Type of Ortho Device: Ace wrap;Sugartong splint Ortho Device/Splint Location: RUE Ortho Device/Splint Interventions: Ordered;Application   [name], Rolanda Campa Craig 03/24/2013, 10:10 PM

## 2013-03-24 NOTE — ED Notes (Signed)
Pt taken back to xray. 

## 2013-03-24 NOTE — ED Notes (Signed)
Phlebotomy at bedside.

## 2013-03-24 NOTE — ED Notes (Signed)
cbg was 247.

## 2013-03-24 NOTE — ED Notes (Addendum)
Pt from home, was doing laundry when she tripped and fell, c/o right hip, right shoulder and right wrist pain. Shortening to right leg, EMS denies deformity to right shoulder and wrist swollen. Denies LOC, pupils reactive and equal.  Given 100 mcg fentanyl, 4 mg of zofran en route. 96% on 2L. Vital signs 147/66, 62 hr, 16 rr, 96% 2L.

## 2013-03-24 NOTE — H&P (Signed)
[facility] History and Physical  Janet Frazier UJW:[id number] DOB: 18-Sep-1930 DOA: 03/24/2013  Referring physician: ER physician. PCP: Thayer Headings, MD   Chief Complaint: Fall with right hip and right wrist pain.  HPI: Janet Frazier is a 80 y.o. female with history of hypertension and diabetes mellitus and previous history of multiple compression fractures of the vertebrae was brought to the ER patient had a mechanical fall at her house. Patient denies having lost consciousness or hit her head. X-rays reveal right hip and right distal radius fracture. On-call orthopedic surgeon Dr. Otelia Sergeant has been consulted. Patient otherwise denies any chest pain shortness of breath nausea vomiting abdominal pain diarrhea. Patient states her blood sugar has been recently running high.   Review of Systems: As presented in the history of presenting illness, rest negative.  Past Medical History  Diagnosis Date  . Osteoporosis   . Diabetes mellitus without complication   . GERD (gastroesophageal reflux disease)   . Back pain   . Hypertension   . Arthritis   . Hepatitis     as a child  . Crohn's disease    Past Surgical History  Procedure Laterality Date  . Knee surgery Right   . Elbow surgery Left   . Abdominal surgery    . Eye surgery      bilateral cataracts  . Colonoscopy    . Hardware removal Left 02/09/2013    Procedure: HARDWARE REMOVAL, IRRIGATION AND DEBRIDEMENT LEFT ELBOW;  Surgeon: Cheral Almas, MD;  Location: [facility] OR;  Service: Orthopedics;  Laterality: Left;   Social History:  reports that she has quit smoking. She has never used smokeless tobacco. She reports that she does not drink alcohol or use illicit drugs. Where does patient live home. Can patient participate in ADLs? Yes.  No Known Allergies  Family History: History reviewed. No pertinent family history.    Prior to Admission medications   Medication Sig Start Date End Date Taking? Authorizing Provider   amLODipine (NORVASC) 5 MG tablet Take 5 mg by mouth daily.   Yes Historical Provider, MD  aspirin EC 81 MG tablet Take 81 mg by mouth every other day.    Yes Historical Provider, MD  atenolol (TENORMIN) 25 MG tablet Take 25 mg by mouth daily.   Yes Historical Provider, MD  Cholecalciferol (VITAMIN D3) 2000 UNITS TABS Take 2,000 Units by mouth daily.    Yes Historical Provider, MD  denosumab (PROLIA) 60 MG/ML SOLN injection Inject 60 mg into the skin every 6 (six) months. Administer in upper arm, thigh, or abdomen (last injection April 2014)   Yes Historical Provider, MD  ferrous fumarate (HEMOCYTE - 106 MG FE) 325 (106 FE) MG TABS Take 1 tablet by mouth every other day.   Yes Historical Provider, MD  furosemide (LASIX) 20 MG tablet Take 20 mg by mouth every other day.   Yes Historical Provider, MD  ibuprofen (ADVIL,MOTRIN) 200 MG tablet Take 200 mg by mouth daily as needed (pain).   Yes Historical Provider, MD  insulin aspart (NOVOLOG) 100 UNIT/ML injection Inject 0-6 Units into the skin 3 (three) times daily with meals. Based on sliding scale   Yes Historical Provider, MD  insulin glargine (LANTUS) 100 UNIT/ML injection Inject 18 Units into the skin daily before breakfast.   Yes Historical Provider, MD  lansoprazole (PREVACID) 30 MG capsule Take 30 mg by mouth daily.   Yes Historical Provider, MD  neomycin-polymyxin-pramoxine (NEOSPORIN PLUS) 1 % cream Apply 1 application topically 2 (  two) times daily as needed (applied to wounds).    Yes Historical Provider, MD  prochlorperazine (COMPAZINE) 5 MG tablet Take 5 mg by mouth 2 (two) times daily as needed for nausea.    Yes Historical Provider, MD  traMADol (ULTRAM) 50 MG tablet Take 50 mg by mouth daily as needed (pain).   Yes Historical Provider, MD    Physical Exam: Filed Vitals:   03/24/13 1739 03/24/13 1745 03/24/13 1845  BP: 130/43 136/50 133/46  Pulse: 61 58 66  Temp: 98.1 F (36.7 C)    TempSrc: Oral    Resp: 17    SpO2: 97% 97% 97%      General:  Well-developed and nourished.  Eyes: Anicteric no pallor.  ENT: No discharge from the ears eyes nose mouth.  Neck: No mass felt.  Cardiovascular: S1-S2 heard.  Respiratory: No rhonchi or crepitations.  Abdomen: Soft nontender bowel sounds present.  Skin: No rash.  Musculoskeletal: Pain on moving her right hip and swelling of the distal right forearm and wrist.  Psychiatric: Appears normal.  Neurologic: Alert awake oriented to time place and person. Moves all extremities.  Labs on Admission:  Basic Metabolic Panel:  Recent Labs Lab 03/24/13 1808  NA 138  K 4.0  CL 100  CO2 27  GLUCOSE 254*  BUN 20  CREATININE 0.56  CALCIUM 8.2*   Liver Function Tests: No results found for this basename: AST, ALT, ALKPHOS, BILITOT, PROT, ALBUMIN,  in the last 168 hours No results found for this basename: LIPASE, AMYLASE,  in the last 168 hours No results found for this basename: AMMONIA,  in the last 168 hours CBC:  Recent Labs Lab 03/24/13 1808  WBC 15.1*  NEUTROABS 13.4*  HGB 11.6*  HCT 34.4*  MCV 86.2  PLT 264   Cardiac Enzymes: No results found for this basename: CKTOTAL, CKMB, CKMBINDEX, TROPONINI,  in the last 168 hours  BNP (last 3 results) No results found for this basename: PROBNP,  in the last 8760 hours CBG:  Recent Labs Lab 03/24/13 2154  GLUCAP 247*    Radiological Exams on Admission: Dg Chest 1 View  03/24/2013   CLINICAL DATA:  Fall.  Wrist fracture. Pre-op respiratory exam.  EXAM: CHEST - 1 VIEW  COMPARISON:  11/12/2012  FINDINGS: Low lung volumes again noted. Mild cardiomegaly is stable. No evidence of acute infiltrate or edema. Mild elevation of left hemidiaphragm and left basilar scarring is unchanged. Old left posterior rib fractures again noted.  IMPRESSION: No acute findings.   Electronically Signed   By: Myles RosenthalJohn  Stahl M.D.   On: 03/24/2013 20:20   Dg Shoulder Right  03/24/2013   CLINICAL DATA:  Fall.  Right shoulder injury and  pain.  EXAM: RIGHT SHOULDER - 2+ VIEW  COMPARISON:  None.  FINDINGS: There is no evidence of fracture or dislocation. Diffuse osteopenia noted. No focal bone lesions identified. Mild soft tissue calcification noted.  IMPRESSION: No acute findings.  Osteopenia.   Electronically Signed   By: Myles RosenthalJohn  Stahl M.D.   On: 03/24/2013 20:21   Dg Wrist Complete Right  03/24/2013   CLINICAL DATA:  Fall.  Right wrist pain and fracture deformity pre  EXAM: RIGHT WRIST - COMPLETE 3+ VIEW  COMPARISON:  None.  FINDINGS: Diffuse osteopenia noted. Highly comminuted and impacted fracture of the distal radius seen, with involvement of both the distal radial ulnar and radiocarpal joints. Dorsal displacement and angulation of the distal articular surface of the radius is seen. Associated  fracture of the ulnar styloid process also noted. Carpal bones remain normal in alignment.  IMPRESSION: Highly comminuted and impacted fracture of the distal radius, with dorsal displacement angulation.  Ulnar styloid process fracture.  Osteopenia.   Electronically Signed   By: Myles Rosenthal M.D.   On: 03/24/2013 20:19   Dg Hip Complete Right  03/24/2013   CLINICAL DATA:  Fall.  Right hip injury and pain.  EXAM: RIGHT HIP - COMPLETE 2+ VIEW  COMPARISON:  None.  FINDINGS: Intertrochanteric right hip fracture is seen with varus angulation. No evidence of hip dislocation.  Diffuse osteopenia noted. Old left pubic rami fractures demonstrated as well as peripheral vascular calcification.  IMPRESSION: Intertrochanteric right hip fracture.  Osteopenia.   Electronically Signed   By: Myles Rosenthal M.D.   On: 03/24/2013 20:22    EKG: Independently reviewed. Normal sinus rhythm.  Assessment/Plan Principal Problem:   Intertrochanteric fracture of right femur Active Problems:   Essential hypertension, benign   Diabetes mellitus   Closed right hip fracture   Hip fracture   1. Right hip and right distal radius fracture status post mechanical fall - I have  discussed with on-call orthopedic surgeon Dr. Otelia Sergeant. Plan is to have surgery done tomorrow by Dr. Roda Shutters probably. Patient will be kept n.p.o. past midnight. Continue with pain medications. Patient appears medically stable for surgery. 2. Diabetes mellitus type 2 - I have ordered one dose of NovoLog insulin 2 units subcutaneous for now and we will closely follow CBGs with sliding-scale coverage and continue patient's home dose of Lantus. 3. Hypertension - we will continue with amlodipine and atenolol. We'll hold Lasix for now until surgery. 4. Anemia - closely follow CBC.  I have reviewed patient's old charts labs. And discussed with on-call orthopedic surgeon.    Code Status: DO NOT RESUSCITATE.  Family Communication: Patient's daughter.  Disposition Plan: Admit to inpatient.    Dequavius Kuhner N. [facility] Pager [phone].  If 7PM-7AM, please contact night-coverage [email or web address] Password [email or web address] 03/24/2013, 10:50 PM

## 2013-03-25 ENCOUNTER — Inpatient Hospital Stay (HOSPITAL_COMMUNITY): Payer: Medicare Other

## 2013-03-25 ENCOUNTER — Encounter (HOSPITAL_COMMUNITY): Payer: Medicare Other | Admitting: Anesthesiology

## 2013-03-25 ENCOUNTER — Inpatient Hospital Stay (HOSPITAL_COMMUNITY): Payer: Medicare Other | Admitting: Anesthesiology

## 2013-03-25 ENCOUNTER — Encounter (HOSPITAL_COMMUNITY)
Admission: EM | Disposition: A | Discharge status: Skilled Nursing Facility | Payer: Self-pay | Source: Home / Self Care | Attending: Internal Medicine

## 2013-03-25 ENCOUNTER — Encounter (HOSPITAL_COMMUNITY): Payer: Self-pay | Admitting: Certified Registered"

## 2013-03-25 DIAGNOSIS — E876 Hypokalemia: Secondary | ICD-10-CM

## 2013-03-25 HISTORY — PX: OPEN REDUCTION INTERNAL FIXATION (ORIF) DISTAL RADIAL FRACTURE: SHX5989

## 2013-03-25 HISTORY — PX: INTRAMEDULLARY (IM) NAIL INTERTROCHANTERIC: SHX5875

## 2013-03-25 LAB — HEPATIC FUNCTION PANEL
ALBUMIN: 3 g/dL — AB (ref 3.5–5.2)
ALK PHOS: 83 U/L (ref 39–117)
ALT: 12 U/L (ref 0–35)
AST: 15 U/L (ref 0–37)
BILIRUBIN TOTAL: 0.3 mg/dL (ref 0.3–1.2)
Bilirubin, Direct: <0.2 mg/dL (ref 0.0–0.3)
TOTAL PROTEIN: 6 g/dL (ref 6.0–8.3)

## 2013-03-25 LAB — GLUCOSE, CAPILLARY
GLUCOSE-CAPILLARY: 142 mg/dL — AB (ref 70–99)
GLUCOSE-CAPILLARY: 191 mg/dL — AB (ref 70–99)
Glucose-Capillary: 267 mg/dL — ABNORMAL HIGH (ref 70–99)
Glucose-Capillary: 182 mg/dL — ABNORMAL HIGH (ref 70–99)
Glucose-Capillary: 138 mg/dL — ABNORMAL HIGH (ref 70–99)

## 2013-03-25 LAB — BASIC METABOLIC PANEL
BUN: 20 mg/dL (ref 6–23)
CO2: 27 mEq/L (ref 19–32)
Calcium: 8.1 mg/dL — ABNORMAL LOW (ref 8.4–10.5)
Chloride: 102 mEq/L (ref 96–112)
Creatinine, Ser: 0.56 mg/dL (ref 0.50–1.10)
GFR calc non Af Amer: 86 mL/min — ABNORMAL LOW (ref >90)
Glucose, Bld: 328 mg/dL — ABNORMAL HIGH (ref 70–99)
POTASSIUM: 4.6 meq/L (ref 3.7–5.3)
Sodium: 141 mEq/L (ref 137–147)

## 2013-03-25 LAB — CBC
HEMATOCRIT: 32.6 % — AB (ref 36.0–46.0)
HEMOGLOBIN: 10.8 g/dL — AB (ref 12.0–15.0)
MCH: 28.7 pg (ref 26.0–34.0)
MCHC: 33.1 g/dL (ref 30.0–36.0)
MCV: 86.7 fL (ref 78.0–100.0)
Platelets: 238 10*3/uL (ref 150–400)
RBC: 3.76 MIL/uL — ABNORMAL LOW (ref 3.87–5.11)
RDW: 13.5 % (ref 11.5–15.5)
WBC: 9.6 10*3/uL (ref 4.0–10.5)

## 2013-03-25 LAB — VITAMIN D 25 HYDROXY (VIT D DEFICIENCY, FRACTURES): VIT D 25 HYDROXY: 35 ng/mL (ref 30–89)

## 2013-03-25 SURGERY — FIXATION, FRACTURE, INTERTROCHANTERIC, WITH INTRAMEDULLARY ROD
Anesthesia: General | Site: Wrist | Laterality: Right

## 2013-03-25 MED ORDER — NEOSTIGMINE METHYLSULFATE 1 MG/ML IJ SOLN
INTRAMUSCULAR | Status: DC | PRN
  Administered 2013-03-25: 2 mg via INTRAVENOUS

## 2013-03-25 MED ORDER — BUPIVACAINE HCL (PF) 0.25 % IJ SOLN
INTRAMUSCULAR | Status: AC
  Filled 2013-03-25: qty 30

## 2013-03-25 MED ORDER — ONDANSETRON HCL 4 MG/2ML IJ SOLN
INTRAMUSCULAR | Status: DC | PRN
  Administered 2013-03-25: 4 mg via INTRAVENOUS

## 2013-03-25 MED ORDER — INSULIN ASPART 100 UNIT/ML ~~LOC~~ SOLN: 0.0000 [IU] | Freq: Every day | SUBCUTANEOUS | Status: DC

## 2013-03-25 MED ORDER — ROCURONIUM BROMIDE 100 MG/10ML IV SOLN
INTRAVENOUS | Status: DC | PRN
  Administered 2013-03-25: 40 mg via INTRAVENOUS
  Administered 2013-03-25: 10 mg via INTRAVENOUS

## 2013-03-25 MED ORDER — EPHEDRINE SULFATE 50 MG/ML IJ SOLN
INTRAMUSCULAR | Status: DC | PRN
  Administered 2013-03-25: 10 mg via INTRAVENOUS
  Administered 2013-03-25: 5 mg via INTRAVENOUS
  Administered 2013-03-25: 10 mg via INTRAVENOUS
  Administered 2013-03-25 (×2): 5 mg via INTRAVENOUS

## 2013-03-25 MED ORDER — OXYCODONE HCL 5 MG PO TABS
5.0000 mg | ORAL_TABLET | ORAL | Status: DC | PRN
  Administered 2013-03-25: 5 mg via ORAL
  Administered 2013-03-26: 10 mg via ORAL
  Administered 2013-03-28: 5 mg via ORAL
  Filled 2013-03-25 (×2): qty 1
  Filled 2013-03-25: qty 2

## 2013-03-25 MED ORDER — SENNA 8.6 MG PO TABS
1.0000 | ORAL_TABLET | Freq: Two times a day (BID) | ORAL | Status: DC
Start: 2013-03-25 — End: 2013-03-28
  Administered 2013-03-25 – 2013-03-28 (×6): 8.6 mg via ORAL
  Filled 2013-03-25 (×9): qty 1

## 2013-03-25 MED ORDER — ACETAMINOPHEN 325 MG PO TABS: 650.0000 mg | ORAL_TABLET | Freq: Four times a day (QID) | ORAL | Status: DC | PRN

## 2013-03-25 MED ORDER — ASPIRIN EC 325 MG PO TBEC
325.0000 mg | DELAYED_RELEASE_TABLET | Freq: Two times a day (BID) | ORAL | Status: DC
  Administered 2013-03-26 – 2013-03-28 (×4): 325 mg via ORAL
  Filled 2013-03-25 (×8): qty 1

## 2013-03-25 MED ORDER — ONDANSETRON HCL 4 MG/2ML IJ SOLN
4.0000 mg | Freq: Once | INTRAMUSCULAR | Status: AC
  Administered 2013-03-25: 4 mg via INTRAVENOUS

## 2013-03-25 MED ORDER — GLYCOPYRROLATE 0.2 MG/ML IJ SOLN
INTRAMUSCULAR | Status: AC
  Filled 2013-03-25: qty 2

## 2013-03-25 MED ORDER — ASPIRIN EC 325 MG PO TBEC: 325.0000 mg | DELAYED_RELEASE_TABLET | Freq: Two times a day (BID) | ORAL | Status: DC

## 2013-03-25 MED ORDER — INSULIN ASPART 100 UNIT/ML ~~LOC~~ SOLN
0.0000 [IU] | Freq: Three times a day (TID) | SUBCUTANEOUS | Status: DC
  Administered 2013-03-26: 3 [IU] via SUBCUTANEOUS
  Administered 2013-03-26: 2 [IU] via SUBCUTANEOUS
  Administered 2013-03-27 – 2013-03-28 (×3): 1 [IU] via SUBCUTANEOUS

## 2013-03-25 MED ORDER — NEOSTIGMINE METHYLSULFATE 1 MG/ML IJ SOLN
INTRAMUSCULAR | Status: AC
  Filled 2013-03-25: qty 10

## 2013-03-25 MED ORDER — METOCLOPRAMIDE HCL 5 MG/ML IJ SOLN
5.0000 mg | Freq: Three times a day (TID) | INTRAMUSCULAR | Status: DC | PRN
  Filled 2013-03-25: qty 2

## 2013-03-25 MED ORDER — MAGNESIUM CITRATE PO SOLN
1.0000 | Freq: Once | ORAL | Status: AC | PRN
  Filled 2013-03-25: qty 296

## 2013-03-25 MED ORDER — ACETAMINOPHEN 650 MG RE SUPP: 650.0000 mg | Freq: Four times a day (QID) | RECTAL | Status: DC | PRN

## 2013-03-25 MED ORDER — GLYCOPYRROLATE 0.2 MG/ML IJ SOLN
INTRAMUSCULAR | Status: DC | PRN
  Administered 2013-03-25: 0.4 mg via INTRAVENOUS

## 2013-03-25 MED ORDER — SODIUM CHLORIDE 0.9 % IV SOLN
INTRAVENOUS | Status: DC
  Administered 2013-03-25: 23:00:00 via INTRAVENOUS

## 2013-03-25 MED ORDER — MENTHOL 3 MG MT LOZG: 1.0000 | LOZENGE | OROMUCOSAL | Status: DC | PRN

## 2013-03-25 MED ORDER — CEFAZOLIN SODIUM-DEXTROSE 2-3 GM-% IV SOLR
2.0000 g | Freq: Four times a day (QID) | INTRAVENOUS | Status: AC
  Administered 2013-03-25 – 2013-03-26 (×3): 2 g via INTRAVENOUS
  Filled 2013-03-25 (×3): qty 50

## 2013-03-25 MED ORDER — FENTANYL CITRATE 0.05 MG/ML IJ SOLN
INTRAMUSCULAR | Status: DC | PRN
  Administered 2013-03-25: 100 ug via INTRAVENOUS

## 2013-03-25 MED ORDER — VITAMIN C 500 MG PO CHEW: 500.0000 mg | CHEWABLE_TABLET | Freq: Two times a day (BID) | ORAL | Status: DC

## 2013-03-25 MED ORDER — ONDANSETRON HCL 4 MG PO TABS
4.0000 mg | ORAL_TABLET | Freq: Four times a day (QID) | ORAL | Status: DC | PRN
Start: 2013-03-25 — End: 2013-03-28

## 2013-03-25 MED ORDER — OXYCODONE HCL 5 MG PO TABS: 5.0000 mg | ORAL_TABLET | ORAL | Status: DC | PRN

## 2013-03-25 MED ORDER — EPHEDRINE SULFATE 50 MG/ML IJ SOLN
INTRAMUSCULAR | Status: AC
  Filled 2013-03-25: qty 1

## 2013-03-25 MED ORDER — ROCURONIUM BROMIDE 50 MG/5ML IV SOLN
INTRAVENOUS | Status: AC
  Filled 2013-03-25: qty 1

## 2013-03-25 MED ORDER — CEFAZOLIN SODIUM-DEXTROSE 2-3 GM-% IV SOLR
INTRAVENOUS | Status: AC
  Administered 2013-03-25: 2 g via INTRAVENOUS
  Filled 2013-03-25: qty 50

## 2013-03-25 MED ORDER — SODIUM CHLORIDE 0.9 % IJ SOLN
INTRAMUSCULAR | Status: AC
  Filled 2013-03-25: qty 10

## 2013-03-25 MED ORDER — ONDANSETRON HCL 4 MG/2ML IJ SOLN
4.0000 mg | Freq: Four times a day (QID) | INTRAMUSCULAR | Status: DC | PRN
  Administered 2013-03-26: 4 mg via INTRAVENOUS
  Filled 2013-03-25: qty 2

## 2013-03-25 MED ORDER — PROPOFOL 10 MG/ML IV BOLUS
INTRAVENOUS | Status: DC | PRN
  Administered 2013-03-25: 50 mg via INTRAVENOUS

## 2013-03-25 MED ORDER — FENTANYL CITRATE 0.05 MG/ML IJ SOLN
INTRAMUSCULAR | Status: AC
  Filled 2013-03-25: qty 5

## 2013-03-25 MED ORDER — FENTANYL CITRATE 0.05 MG/ML IJ SOLN: 25.0000 ug | INTRAMUSCULAR | Status: DC | PRN

## 2013-03-25 MED ORDER — ALUM & MAG HYDROXIDE-SIMETH 200-200-20 MG/5ML PO SUSP: 30.0000 mL | ORAL | Status: DC | PRN

## 2013-03-25 MED ORDER — LIDOCAINE HCL (CARDIAC) 20 MG/ML IV SOLN
INTRAVENOUS | Status: AC
  Filled 2013-03-25: qty 5

## 2013-03-25 MED ORDER — ONDANSETRON HCL 4 MG/2ML IJ SOLN
INTRAMUSCULAR | Status: AC
  Filled 2013-03-25: qty 2

## 2013-03-25 MED ORDER — BUPIVACAINE HCL (PF) 0.25 % IJ SOLN
INTRAMUSCULAR | Status: DC | PRN
  Administered 2013-03-25: 10 mL

## 2013-03-25 MED ORDER — LACTATED RINGERS IV SOLN
INTRAVENOUS | Status: DC | PRN
  Administered 2013-03-25 (×2): via INTRAVENOUS

## 2013-03-25 MED ORDER — SORBITOL 70 % SOLN
30.0000 mL | Freq: Every day | Status: DC | PRN
  Administered 2013-03-28: 30 mL via ORAL
  Filled 2013-03-25 (×2): qty 30

## 2013-03-25 MED ORDER — METHOCARBAMOL 100 MG/ML IJ SOLN
500.0000 mg | Freq: Four times a day (QID) | INTRAVENOUS | Status: DC | PRN
  Filled 2013-03-25: qty 5

## 2013-03-25 MED ORDER — ARTIFICIAL TEARS OP OINT
TOPICAL_OINTMENT | OPHTHALMIC | Status: DC | PRN
  Administered 2013-03-25: 1 via OPHTHALMIC

## 2013-03-25 MED ORDER — LIDOCAINE HCL (CARDIAC) 20 MG/ML IV SOLN
INTRAVENOUS | Status: DC | PRN
  Administered 2013-03-25: 20 mg via INTRAVENOUS

## 2013-03-25 MED ORDER — DROPERIDOL 2.5 MG/ML IJ SOLN
0.6250 mg | INTRAMUSCULAR | Status: DC | PRN
  Filled 2013-03-25: qty 0.25

## 2013-03-25 MED ORDER — HYDROCODONE-ACETAMINOPHEN 5-325 MG PO TABS: 1.0000 | ORAL_TABLET | Freq: Four times a day (QID) | ORAL | Status: DC | PRN

## 2013-03-25 MED ORDER — FENTANYL CITRATE 0.05 MG/ML IJ SOLN
INTRAMUSCULAR | Status: AC
  Filled 2013-03-25: qty 2

## 2013-03-25 MED ORDER — MORPHINE SULFATE 2 MG/ML IJ SOLN: 0.5000 mg | INTRAMUSCULAR | Status: DC | PRN

## 2013-03-25 MED ORDER — METHOCARBAMOL 500 MG PO TABS: 500.0000 mg | ORAL_TABLET | Freq: Four times a day (QID) | ORAL | Status: DC | PRN

## 2013-03-25 MED ORDER — METOCLOPRAMIDE HCL 5 MG PO TABS
5.0000 mg | ORAL_TABLET | Freq: Three times a day (TID) | ORAL | Status: DC | PRN
  Filled 2013-03-25: qty 2

## 2013-03-25 MED ORDER — PHENOL 1.4 % MT LIQD: 1.0000 | OROMUCOSAL | Status: DC | PRN

## 2013-03-25 MED ORDER — POLYETHYLENE GLYCOL 3350 17 G PO PACK
17.0000 g | PACK | Freq: Every day | ORAL | Status: DC | PRN
  Filled 2013-03-25 (×2): qty 1

## 2013-03-25 SURGICAL SUPPLY — 89 items
BANDAGE ELASTIC 3 VELCRO ST LF (GAUZE/BANDAGES/DRESSINGS) ×4 IMPLANT
BANDAGE ELASTIC 4 VELCRO ST LF (GAUZE/BANDAGES/DRESSINGS) ×4 IMPLANT
BANDAGE GAUZE ELAST BULKY 4 IN (GAUZE/BANDAGES/DRESSINGS) ×2 IMPLANT
BIT DRILL 2.2 SS TIBIAL (BIT) ×2 IMPLANT
BLADE SURG 15 STRL LF DISP TIS (BLADE) ×2 IMPLANT
BLADE SURG 15 STRL SS (BLADE) ×4
BLADE SURG ROTATE 9660 (MISCELLANEOUS) IMPLANT
BNDG CMPR 9X4 STRL LF SNTH (GAUZE/BANDAGES/DRESSINGS) ×2
BNDG COHESIVE 4X5 TAN NS LF (GAUZE/BANDAGES/DRESSINGS) ×4 IMPLANT
BNDG ESMARK 4X9 LF (GAUZE/BANDAGES/DRESSINGS) ×4 IMPLANT
BNDG GAUZE ELAST 4 BULKY (GAUZE/BANDAGES/DRESSINGS) ×2 IMPLANT
CLOTH BEACON ORANGE TIMEOUT ST (SAFETY) ×4 IMPLANT
CORDS BIPOLAR (ELECTRODE) ×4 IMPLANT
COVER SURGICAL LIGHT HANDLE (MISCELLANEOUS) ×4 IMPLANT
CUFF TOURNIQUET SINGLE 18IN (TOURNIQUET CUFF) ×4 IMPLANT
CUFF TOURNIQUET SINGLE 24IN (TOURNIQUET CUFF) IMPLANT
DECANTER SPIKE VIAL GLASS SM (MISCELLANEOUS) IMPLANT
DRAPE C-ARM 42X72 X-RAY (DRAPES) ×4 IMPLANT
DRAPE EXTREMITY T 121X128X90 (DRAPE) ×2 IMPLANT
DRAPE OEC MINIVIEW 54X84 (DRAPES) IMPLANT
DRAPE PROXIMA HALF (DRAPES) ×4 IMPLANT
DRAPE STERI IOBAN 125X83 (DRAPES) ×2 IMPLANT
DRAPE U-SHAPE 47X51 STRL (DRAPES) ×4 IMPLANT
DRSG MEPILEX BORDER 4X4 (GAUZE/BANDAGES/DRESSINGS) ×2 IMPLANT
DRSG MEPILEX BORDER 4X8 (GAUZE/BANDAGES/DRESSINGS) ×6 IMPLANT
DRSG PAD ABDOMINAL 8X10 ST (GAUZE/BANDAGES/DRESSINGS) ×4 IMPLANT
DURAPREP 26ML APPLICATOR (WOUND CARE) ×6 IMPLANT
ELECT CAUTERY BLADE 6.4 (BLADE) ×4 IMPLANT
ELECT REM PT RETURN 9FT ADLT (ELECTROSURGICAL) ×4
ELECTRODE REM PT RTRN 9FT ADLT (ELECTROSURGICAL) ×2 IMPLANT
FACESHIELD LNG OPTICON STERILE (SAFETY) ×4 IMPLANT
GAUZE XEROFORM 1X8 LF (GAUZE/BANDAGES/DRESSINGS) ×2 IMPLANT
GAUZE XEROFORM 5X9 LF (GAUZE/BANDAGES/DRESSINGS) ×4 IMPLANT
GLOVE ORTHO TXT STRL SZ7.5 (GLOVE) ×4 IMPLANT
GLOVE SURG SS PI 7.5 STRL IVOR (GLOVE) ×12 IMPLANT
GOWN STRL NON-REIN LRG LVL3 (GOWN DISPOSABLE) ×10 IMPLANT
GOWN STRL REIN XL XLG (GOWN DISPOSABLE) ×8 IMPLANT
GUIDE PIN 3.2MM (MISCELLANEOUS) ×4
GUIDE PIN ORTH 343X3.2XBRAD (MISCELLANEOUS) IMPLANT
K-WIRE 1.6 (WIRE) ×4
K-WIRE FX5X1.6XNS BN SS (WIRE) ×2
KIT BASIN OR (CUSTOM PROCEDURE TRAY) ×4 IMPLANT
KIT ROOM TURNOVER OR (KITS) ×4 IMPLANT
KWIRE FX5X1.6XNS BN SS (WIRE) IMPLANT
MANIFOLD NEPTUNE II (INSTRUMENTS) ×2 IMPLANT
NAIL TRIGEN RT 11.5X38-125 (Nail) ×2 IMPLANT
NEEDLE 22X1 1/2 (OR ONLY) (NEEDLE) IMPLANT
NS IRRIG 1000ML POUR BTL (IV SOLUTION) ×4 IMPLANT
PACK GENERAL/GYN (CUSTOM PROCEDURE TRAY) ×4 IMPLANT
PACK ORTHO EXTREMITY (CUSTOM PROCEDURE TRAY) ×2 IMPLANT
PAD ARMBOARD 7.5X6 YLW CONV (MISCELLANEOUS) ×12 IMPLANT
PAD CAST 4YDX4 CTTN HI CHSV (CAST SUPPLIES) ×4 IMPLANT
PADDING CAST ABS 4INX4YD NS (CAST SUPPLIES) ×2
PADDING CAST ABS COTTON 4X4 ST (CAST SUPPLIES) IMPLANT
PADDING CAST COTTON 4X4 STRL (CAST SUPPLIES)
PLATE NARROW DVR RIGHT (Plate) ×2 IMPLANT
SCREW LAG COMPR KIT 85/80 (Screw) ×4 IMPLANT
SCREW LOCK 16X2.7X 3 LD TPR (Screw) IMPLANT
SCREW LOCK 18X2.7X 3 LD TPR (Screw) IMPLANT
SCREW LOCK 22X2.7X 3 LD TPR (Screw) IMPLANT
SCREW LOCK 24X2.7X3 LD THRD (Screw) IMPLANT
SCREW LOCKING 2.7X15MM (Screw) ×2 IMPLANT
SCREW LOCKING 2.7X16 (Screw) ×8 IMPLANT
SCREW LOCKING 2.7X18 (Screw) ×4 IMPLANT
SCREW LOCKING 2.7X22MM (Screw) ×4 IMPLANT
SCREW LOCKING 2.7X24MM (Screw) ×4 IMPLANT
SCREW NLOCK 24X2.7 3 LD (Screw) IMPLANT
SCREW NONLOCK 2.7X24 (Screw) ×4 IMPLANT
SPLINT PLASTER CAST XFAST 5X30 (CAST SUPPLIES) IMPLANT
SPLINT PLASTER XFAST SET 5X30 (CAST SUPPLIES) ×2
SPONGE GAUZE 4X4 12PLY (GAUZE/BANDAGES/DRESSINGS) ×4 IMPLANT
SPONGE LAP 4X18 X RAY DECT (DISPOSABLE) IMPLANT
STAPLER VISISTAT 35W (STAPLE) ×4 IMPLANT
SUCTION FRAZIER TIP 10 FR DISP (SUCTIONS) ×2 IMPLANT
SUT ETHILON 4 0 PS 2 18 (SUTURE) ×2 IMPLANT
SUT MNCRL AB 4-0 PS2 18 (SUTURE) IMPLANT
SUT PROLENE 3 0 PS 2 (SUTURE) IMPLANT
SUT VIC AB 0 CT1 27 (SUTURE) ×8
SUT VIC AB 0 CT1 27XBRD ANBCTR (SUTURE) ×4 IMPLANT
SUT VIC AB 2-0 CT1 27 (SUTURE) ×8
SUT VIC AB 2-0 CT1 TAPERPNT 27 (SUTURE) ×4 IMPLANT
SUT VIC AB 3-0 FS2 27 (SUTURE) IMPLANT
SYR CONTROL 10ML LL (SYRINGE) IMPLANT
TOWEL OR 17X24 6PK STRL BLUE (TOWEL DISPOSABLE) ×4 IMPLANT
TOWEL OR 17X26 10 PK STRL BLUE (TOWEL DISPOSABLE) ×4 IMPLANT
TUBE CONNECTING 12'X1/4 (SUCTIONS) ×2
TUBE CONNECTING 12X1/4 (SUCTIONS) ×4 IMPLANT
UNDERPAD 30X30 INCONTINENT (UNDERPADS AND DIAPERS) ×2 IMPLANT
WATER STERILE IRR 1000ML POUR (IV SOLUTION) ×4 IMPLANT

## 2013-03-25 NOTE — Anesthesia Postprocedure Evaluation (Signed)
Anesthesia Post Note  Patient: Janet Frazier  Procedure(s) Performed: Procedure(s) (LRB): INTRAMEDULLARY (IM) NAIL RIGHT INTERTROCHANTRIC HIP FRACTURE (Right) OPEN REDUCTION INTERNAL FIXATION (ORIF) RIGHT DISTAL RADIUS FRACTURE (Right)  Anesthesia type: general  Patient location: PACU  Post pain: Pain level controlled  Post assessment: Patient's Cardiovascular Status Stable  Last Vitals:  Filed Vitals:   03/25/13 1945  BP: 120/41  Pulse: 59  Temp:   Resp: 21    Post vital signs: Reviewed and stable  Level of consciousness: sedated  Complications: No apparent anesthesia complications

## 2013-03-25 NOTE — Op Note (Signed)
Date of Surgery: 03/25/2013  INDICATIONS: [name] is a 80 y.o.-year-old female who was involved in a mechanical fall and sustained a right hip and distal radius fracture (pertrochanteric-type). The risks and benefits of the procedure discussed with the patient and family prior to the procedure and all questions were answered; consent was obtained.  PREOPERATIVE DIAGNOSIS:  1. right hip fracture (intertrochanteric-type)  2. Right distal radius fracture, intraarticular  POSTOPERATIVE DIAGNOSIS: Same   PROCEDURE:  1. Treatment of intertrochanteric fracture with intramedullary implant. CPT [phone]  2. Open reduction and internal fixation, Right distal radius.  CPT [phone] 3 or more fragments   SURGEON: N. Glee ArvinMichael Xu, M.D.   ANESTHESIA: general   IV FLUIDS AND URINE: See anesthesia record   ESTIMATED BLOOD LOSS: 150 cc  IMPLANTS:  1. [employer] InterTAN 11.5 x 38 mm 2. Biomet DVR plate  TOURNIQUET TIME: 45 minutes  DRAINS: None.   COMPLICATIONS: None.   DESCRIPTION OF PROCEDURE: The patient was brought to the operating room and placed supine on the operating table. The patient's leg had been signed prior to the procedure. The patient had the anesthesia placed by the anesthesiologist. The prep verification and incision time-outs were performed to confirm that this was the correct patient, site, side and location. The patient had an SCD on the opposite lower extremity. The patient did receive antibiotics prior to the incision and was re-dosed during the procedure as needed at indicated intervals. The patient was positioned on the fracture table with the table in traction and internal rotation to reduce the hip. The well leg was placed in a hemi-lithotomy position and all bony prominences were well-padded. The patient had the lower extremity prepped and draped in the standard surgical fashion. The incision was made 4 finger breadths superior to the greater trochanter. A guide pin was  inserted into the tip of the greater trochanter under fluoroscopic guidance. An opening reamer was used to gain access to the femoral canal. The nail length was measured and inserted down the femoral canal to its proper depth. The appropriate version of insertion for the lag screw was found under fluoroscopy. A pin was inserted up the femoral neck through the jig. Then, a second antirotation pin was inserted inferior to the first pin. The length of the lag screw was then measured. The lag screw was inserted as near to center-center in the head as possible. The antirotation pin was then taken out and an interdigitating compression screw was placed in its place. The leg was taken out of traction, then the interdigitating compression screw was used to compress across the fracture. Compression was visualized on serial xrays. The wound was copiously irrigated with saline and the subcutaneous layer closed with 2.0 vicryl and the skin was reapproximated with staples. The wounds were cleaned and dried a final time and a sterile dressing was placed. The hip was taken through a range of motion at the end of the case under fluoroscopic imaging to visualize the approach-withdraw phenomenon and confirm implant length in the head. The patient was then awakened from anesthesia and taken to the recovery room in stable condition. All counts were correct at the end of the case.  We then turned our attention to the right wrist.  The right upper extremity was prepped and draped in standard sterile fashion.  It was exsanguinated, tourniquet inflated to 250 mmHg.  A FCR approach was used.  Dissection through the sheath of FCR was carried out and the FCR  tendon was retracted. The floor of FCR sheath was released. Flexor tendons and median nerve were retracted ulnarly and protected. Pronator quadratus was released from distal radius. Fracture lines were visualized. Fracture was reduced under fluoroscopy. A volar locking plate was applied  to the volar surface of the distal radius. Six distal locking screws and 3 shaft  screws were inserted. Fluoroscopy was used to show adequate reduction. The tourniquet was deflated. Hemostasis was achieved. The wound was irrigated. Incision closed in layers. Sterile dressing applied. Wrist immobilized in a plaster splint. The patient was transferred to the recovery room in stable condition after all counts were correct.   POSTOPERATIVE PLAN: Patient will be placed on aspirin for DVT prophylaxis.  We will obtain a CT scan of the right knee to evaluate for tibial plateau fracture.  Weight bearing recommendations for the right lower extremity will be pending CT scan.   The right upper extremity will be platform weight bearing.   Mayra Reel, MD [facility] [phone] 6:59 PM

## 2013-03-25 NOTE — Progress Notes (Signed)
   CARE MANAGEMENT NOTE 03/25/2013  Patient:  Janet Frazier,Janet Frazier   Account Number:  [id number]  Date Initiated:  03/25/2013  Documentation initiated by:  Jiles CrockerHANDLER,Dymond Spreen  Subjective/Objective Assessment:   ADMITTED WITH HIP FRACTURE     Action/Plan:   CM FOLLOWING FOR DCP   Anticipated DC Date:  03/31/2013   Anticipated DC Plan:  SKILLED NURSING FACILITY  In-house referral  Clinical Social Worker      DC Planning Services  CM consult          Status of service:  In process, will continue to follow Medicare Important Message given?  NA - LOS <3 / Initial given by admissions (If response is "NO", the following Medicare IM given date fields will be blank)  Per UR Regulation:  Reviewed for med. necessity/level of care/duration of stay  Comments:  1/21/2015Abelino Derrick- B Zelia Yzaguirre RN,BSN,MHA [phone]

## 2013-03-25 NOTE — Progress Notes (Signed)
Spoke to patient's daughter on the phone. Discussed treatment recommendations and plan.  Will plan on surgery later today.  Continue NPO.  Mayra ReelN. Michael Shaindy Reader, MD [facility] [phone] 8:36 AM

## 2013-03-25 NOTE — Progress Notes (Signed)
Patient ID: Janet Frazier, female   DOB: 08/12/1930, 80 y.o.   MRN: [id number] Unable to have hand coverage for surgery tonight, will ask Dr Roda ShuttersXu in AM if he will see this patient and perform ORIF of the right distal radius Fracture site. Or both wrist and hand if he desires. Elevate the right wrist and apply ice to decrease swelling. Expect surgery tomorrow afternoon.

## 2013-03-25 NOTE — Progress Notes (Signed)
Agree with dietetic intern note. Lyncoln Ledgerwood Barnett RD, LDN Pager: [phone] After Hours Pager: [phone]  

## 2013-03-25 NOTE — Progress Notes (Signed)
Inpatient Diabetes Program Recommendations  AACE/ADA: New Consensus Statement on Inpatient Glycemic Control (2013)  Target Ranges:  Prepandial:   less than 140 mg/dL      Peak postprandial:   less than 180 mg/dL (1-2 hours)      Critically ill patients:  140 - 180 mg/dL   Reason for Visit: Hyperglycemia  Results for Janet Frazier, Janet Frazier (MRN [id number]) as of 03/25/2013 15:26  Ref. Range 03/24/2013 21:54 03/25/2013 07:34 03/25/2013 12:18  Glucose-Capillary Latest Range: 70-99 mg/dL 940247 (H) 768267 (H) 088182 (H)  Results for Janet Frazier, Janet Frazier (MRN [id number]) as of 03/25/2013 15:26  Ref. Range 11/12/2012 18:35  Hemoglobin A1C Latest Range: <5.7 % 9.1 (H)    Inpatient Diabetes Program Recommendations Insulin - Basal: Lantus 18 units QAM started this morning (home dose) Correction (SSI): Change Novolog sensitive to Q4 while NPO HgbA1C: 9.1%  uncontrolled Diet: When advanced CHO mod med  Note: Will continue to follow while inpatient.  Thank you. Ailene Ardshonda Aleen Marston, RD, LDN, CDE Inpatient Diabetes Coordinator [phone]

## 2013-03-25 NOTE — Anesthesia Preprocedure Evaluation (Signed)
Anesthesia Evaluation  Patient identified by MRN, date of birth, ID band Patient awake    Reviewed: Allergy & Precautions, H&P , NPO status , Patient's Chart, lab work & pertinent test results  Airway Mallampati: II TM Distance: >3 FB Neck ROM: Full    Dental  (+) Teeth Intact, Dental Advisory Given and Caps   Pulmonary former smoker,  breath sounds clear to auscultation  Pulmonary exam normal       Cardiovascular hypertension, Pt. on medications Rhythm:Regular Rate:Normal     Neuro/Psych Back pain    GI/Hepatic GERD-  Medicated and Controlled,(+) Hepatitis - (as a child), A  Endo/Other  diabetes (glu 138), Insulin Dependent  Renal/GU negative Renal ROS     Musculoskeletal   Abdominal   Peds  Hematology   Anesthesia Other Findings   Reproductive/Obstetrics                           Anesthesia Physical Anesthesia Plan  ASA: III  Anesthesia Plan: General   Post-op Pain Management:    Induction: Intravenous  Airway Management Planned: Oral ETT  Additional Equipment:   Intra-op Plan:   Post-operative Plan: Extubation in OR  Informed Consent: I have reviewed the patients History and Physical, chart, labs and discussed the procedure including the risks, benefits and alternatives for the proposed anesthesia with the patient or authorized representative who has indicated his/her understanding and acceptance.   Dental advisory given  Plan Discussed with: CRNA and Surgeon  Anesthesia Plan Comments: (Plan routine monitors, GETA)        Anesthesia Quick Evaluation

## 2013-03-25 NOTE — Progress Notes (Signed)
[facility] PROGRESS NOTE  Janet Frazier:[id number] DOB: [birth year] DOA: 03/24/2013 PCP: Thayer Headings, MD  Assessment/Plan: Right hip and right distal radius fracture status post mechanical fall -  -surgery done tomorrow by Dr. Roda Shutters 1/21.  NPO Pain medications  Diabetes mellitus type 2 - -lantus +novolog with meals -SSI- once patient back from surgery and eating  Hypertension -  -amlodipine and atenolol.  -hold Lasix   Anemia - closely follow CBC.   Code Status: DNR Family Communication: daughter at bedside Disposition Plan:    Consultants:  ortho  Procedures:    Antibiotics:    HPI/Subjective: Pain controlled No CP, no SOB  Objective: Filed Vitals:   03/25/13 0856  BP: 131/47  Pulse: 71  Temp: 98.6 F (37 C)  Resp: 20   No intake or output data in the 24 hours ending 03/25/13 1205 Filed Weights   03/25/13 0500  Weight: 52.164 kg (115 lb)    Exam:  General: Well-developed and nourished.  Cardiovascular: S1-S2 heard.  Respiratory: No rhonchi or crepitations, clear Abdomen: Soft nontender bowel sounds present.  Skin: No rash.  Musculoskeletal: Pain on moving her right hip and swelling of the distal right forearm and wrist.    Data Reviewed: Basic Metabolic Panel:  Recent Labs Lab 03/24/13 1808 03/25/13 0446  NA 138 141  K 4.0 4.6  CL 100 102  CO2 27 27  GLUCOSE 254* 328*  BUN 20 20  CREATININE 0.56 0.56  CALCIUM 8.2* 8.1*   Liver Function Tests:  Recent Labs Lab 03/25/13 0446  AST 15  ALT 12  ALKPHOS 83  BILITOT 0.3  PROT 6.0  ALBUMIN 3.0*   No results found for this basename: LIPASE, AMYLASE,  in the last 168 hours No results found for this basename: AMMONIA,  in the last 168 hours CBC:  Recent Labs Lab 03/24/13 1808 03/25/13 0446  WBC 15.1* 9.6  NEUTROABS 13.4*  --   HGB 11.6* 10.8*  HCT 34.4* 32.6*  MCV 86.2 86.7  PLT 264 238   Cardiac Enzymes: No results found for this basename: CKTOTAL,  CKMB, CKMBINDEX, TROPONINI,  in the last 168 hours BNP (last 3 results) No results found for this basename: PROBNP,  in the last 8760 hours CBG:  Recent Labs Lab 03/24/13 2154 03/25/13 0734  GLUCAP 247* 267*    No results found for this or any previous visit (from the past 240 hour(s)).   Studies: Dg Chest 1 View  03/24/2013   CLINICAL DATA:  Fall.  Wrist fracture. Pre-op respiratory exam.  EXAM: CHEST - 1 VIEW  COMPARISON:  11/12/2012  FINDINGS: Low lung volumes again noted. Mild cardiomegaly is stable. No evidence of acute infiltrate or edema. Mild elevation of left hemidiaphragm and left basilar scarring is unchanged. Old left posterior rib fractures again noted.  IMPRESSION: No acute findings.   Electronically Signed   By: Myles Rosenthal M.D.   On: 03/24/2013 20:20   Dg Shoulder Right  03/24/2013   CLINICAL DATA:  Fall.  Right shoulder injury and pain.  EXAM: RIGHT SHOULDER - 2+ VIEW  COMPARISON:  None.  FINDINGS: There is no evidence of fracture or dislocation. Diffuse osteopenia noted. No focal bone lesions identified. Mild soft tissue calcification noted.  IMPRESSION: No acute findings.  Osteopenia.   Electronically Signed   By: Myles Rosenthal M.D.   On: 03/24/2013 20:21   Dg Wrist Complete Right  03/24/2013   CLINICAL DATA:  Fall.  Right wrist pain and fracture  deformity pre  EXAM: RIGHT WRIST - COMPLETE 3+ VIEW  COMPARISON:  None.  FINDINGS: Diffuse osteopenia noted. Highly comminuted and impacted fracture of the distal radius seen, with involvement of both the distal radial ulnar and radiocarpal joints. Dorsal displacement and angulation of the distal articular surface of the radius is seen. Associated fracture of the ulnar styloid process also noted. Carpal bones remain normal in alignment.  IMPRESSION: Highly comminuted and impacted fracture of the distal radius, with dorsal displacement angulation.  Ulnar styloid process fracture.  Osteopenia.   Electronically Signed   By: Myles RosenthalJohn  Stahl  M.D.   On: 03/24/2013 20:19   Dg Hip Complete Right  03/24/2013   CLINICAL DATA:  Fall.  Right hip injury and pain.  EXAM: RIGHT HIP - COMPLETE 2+ VIEW  COMPARISON:  None.  FINDINGS: Intertrochanteric right hip fracture is seen with varus angulation. No evidence of hip dislocation.  Diffuse osteopenia noted. Old left pubic rami fractures demonstrated as well as peripheral vascular calcification.  IMPRESSION: Intertrochanteric right hip fracture.  Osteopenia.   Electronically Signed   By: Myles RosenthalJohn  Stahl M.D.   On: 03/24/2013 20:22    Scheduled Meds: . amLODipine  5 mg Oral Daily  . atenolol  25 mg Oral Daily  . cholecalciferol  2,000 Units Oral Daily  . ferrous fumarate  1 tablet Oral QODAY  . insulin glargine  18 Units Subcutaneous QAC breakfast  . pantoprazole  40 mg Oral Daily   Continuous Infusions: . sodium chloride 20 mL/hr (03/24/13 2338)    Principal Problem:   Intertrochanteric fracture of right femur Active Problems:   Essential hypertension, benign   Diabetes mellitus   Closed right hip fracture   Hip fracture    Time spent: 35 min    [name]  [facility] Pager [phone] If 7PM-7AM, please contact night-coverage at [email or web address], password [email or web address] 03/25/2013, 12:05 PM  LOS: 1 day

## 2013-03-25 NOTE — Progress Notes (Signed)
INITIAL NUTRITION ASSESSMENT  DOCUMENTATION CODES Per approved criteria  -Not Applicable   INTERVENTION: 1.  Add Glucerna as PO diet advances  NUTRITION DIAGNOSIS: Inadequate oral intake related to inability to eat as evidenced by NPO status.   Goal: Patient to meet >/= 90% of estimated nutrition needs  Monitor:  Weight trends, lab trends, I/Os, PO diet advancement  Reason for Assessment: Nutrition Consult- hip fracture  80 y.o. female  Admitting Dx: Intertrochanteric fracture of right femur  ASSESSMENT: Patient is an 80 y.o. female with history of hypertension, diabetes mellitus, previous history of multiple compression fractures of the vertebrae, GERD, and Crohn's disease was brought to the ER after a mechanical fall at her house on 1/20. Patient presents with right hip and wright wrist fractures. Expect surgery 1/21 afternoon.   Upon dietetic intern visit, patient states that her appetite is good and she was eating well while at home. Patient reported that she lives alone and eats 3 meals a day. Per medical record, patient has a history of weight loss in December but current weights are trending up.   Dietetic intern encouraged patient to continue to eat well after surgery. Provided patient with tips to increase protein intake. Patient stated that she would try Glucerna once PO diet advances to help maintain her nutrition status.  Nutrition Focused Physical Exam:  Subcutaneous Fat:  Orbital Region: WNL Upper Arm Region: mild depletion Thoracic and Lumbar Region: N/A  Muscle:  Temple Region: WNL Clavicle Bone Region: WNL Clavicle and Acromion Bone Region: mild depletion Scapular Bone Region: N/A Dorsal Hand: WNL Patellar Region: moderate depletion Anterior Thigh Region: moderate depletion Posterior Calf Region: N/A  Edema: absent    Height: Ht Readings from Last 1 Encounters:  03/25/13 5\' 2"  (1.575 m)    Weight: Wt Readings from Last 1 Encounters:   03/25/13 115 lb (52.164 kg)    Ideal Body Weight: 110 lb  % Ideal Body Weight: 105%  Wt Readings from Last 10 Encounters:  03/25/13 115 lb (52.164 kg)  03/25/13 115 lb (52.164 kg)  02/05/13 95 lb (43.092 kg)  02/05/13 95 lb (43.092 kg)  11/14/12 110 lb 3.2 oz (49.986 kg)  08/18/12 122 lb (55.339 kg)    Usual Body Weight: 120 lb  % Usual Body Weight: 96%  BMI:  Body mass index is 21.03 kg/(m^2).  Estimated Nutritional Needs: Kcal: 1300-1500 Protein: 65-75 grams Fluid: 1.5 L  Skin: no wounds noted  Diet Order: NPO  EDUCATION NEEDS: -No education needs identified at this time  No intake or output data in the 24 hours ending 03/25/13 1023  Last BM: PTA  Labs:   Recent Labs Lab 03/24/13 1808 03/25/13 0446  NA 138 141  K 4.0 4.6  CL 100 102  CO2 27 27  BUN 20 20  CREATININE 0.56 0.56  CALCIUM 8.2* 8.1*  GLUCOSE 254* 328*    CBG (last 3)   Recent Labs  03/24/13 2154 03/25/13 0734  GLUCAP 247* 267*    Scheduled Meds: . amLODipine  5 mg Oral Daily  . atenolol  25 mg Oral Daily  . cholecalciferol  2,000 Units Oral Daily  . ferrous fumarate  1 tablet Oral QODAY  . insulin glargine  18 Units Subcutaneous QAC breakfast  . pantoprazole  40 mg Oral Daily    Continuous Infusions: . sodium chloride 20 mL/hr (03/24/13 2338)    Past Medical History  Diagnosis Date  . Osteoporosis   . Diabetes mellitus without complication   .  GERD (gastroesophageal reflux disease)   . Back pain   . Hypertension   . Arthritis   . Hepatitis     as a child  . Crohn's disease     Past Surgical History  Procedure Laterality Date  . Knee surgery Right   . Elbow surgery Left   . Abdominal surgery    . Eye surgery      bilateral cataracts  . Colonoscopy    . Hardware removal Left 02/09/2013    Procedure: HARDWARE REMOVAL, IRRIGATION AND DEBRIDEMENT LEFT ELBOW;  Surgeon: Cheral Almas, MD;  Location: [facility] OR;  Service: Orthopedics;  Laterality: Left;     Marlane Mingle, Dietetic Intern Pager: [phone]

## 2013-03-25 NOTE — Anesthesia Procedure Notes (Signed)
Procedure Name: Intubation Date/Time: 03/25/2013 4:42 PM Performed by: Jefm MilesENNIE, Shakhia Gramajo E Pre-anesthesia Checklist: Patient identified, Emergency Drugs available, Suction available, Patient being monitored and Timeout performed Patient Re-evaluated:Patient Re-evaluated prior to inductionOxygen Delivery Method: Circle system utilized Preoxygenation: Pre-oxygenation with 100% oxygen Intubation Type: IV induction Ventilation: Mask ventilation without difficulty Laryngoscope Size: Mac and 3 Grade View: Grade I Tube type: Oral Number of attempts: 1 Airway Equipment and Method: Stylet Placement Confirmation: ETT inserted through vocal cords under direct vision,  positive ETCO2 and breath sounds checked- equal and bilateral Secured at: 20 cm Tube secured with: Tape Dental Injury: Teeth and Oropharynx as per pre-operative assessment

## 2013-03-25 NOTE — Transfer of Care (Signed)
Immediate Anesthesia Transfer of Care Note  Patient: Janet Frazier  Procedure(s) Performed: Procedure(s): INTRAMEDULLARY (IM) NAIL RIGHT INTERTROCHANTRIC HIP FRACTURE (Right) OPEN REDUCTION INTERNAL FIXATION (ORIF) RIGHT DISTAL RADIUS FRACTURE (Right)  Patient Location: PACU  Anesthesia Type:General  Level of Consciousness: awake and alert   Airway & Oxygen Therapy: Patient Spontanous Breathing and Patient connected to nasal cannula oxygen  Post-op Assessment: Report given to PACU RN and Post -op Vital signs reviewed and stable  Post vital signs: Reviewed and stable  Complications: No apparent anesthesia complications

## 2013-03-25 NOTE — Preoperative (Signed)
Beta Blockers   Reason not to administer Beta Blockers:Not Applicable 

## 2013-03-25 NOTE — OR Nursing (Signed)
Pt to CT Scan

## 2013-03-25 NOTE — Clinical Social Work Psychosocial (Signed)
Clinical Social Work Department BRIEF PSYCHOSOCIAL ASSESSMENT 03/25/2013  Patient:  Janet Frazier, Janet Frazier     Account Number:  [id number]     Admit date:  03/24/2013  Clinical Social Worker:  Donna Christen  Date/Time:  03/25/2013 03:13 PM  Referred by:  Physician  Date Referred:  03/25/2013 Referred for  SNF Placement   Other Referral:   none.   Interview type:  Patient Other interview type:   CSW spoke to pt at bedside.    PSYCHOSOCIAL DATA Living Status:  FAMILY Admitted from facility:   Level of care:   Primary support name:  Elta Guadeloupe Primary support relationship to patient:  CHILD, ADULT Degree of support available:   Pt stated that she lives with her children. Pt stated that she has strong support system from them.    CURRENT CONCERNS Current Concerns  Post-Acute Placement   Other Concerns:   none.    SOCIAL WORK ASSESSMENT / PLAN CSW met with pt at bedside to discuss possible SNF placement. Pt stated that she has been to SNF before, but could not remember the name of the facility. Pt stated that she lives at home with her children. Pt seemed to be pleasantly confused and was not offering much information to CSW assessment. CSW asked if pt wanted CSW to talk to anyone in her family regarding her discharge disposition, pt stated that CSW could call her son Elta Guadeloupe. CSW will call Elta Guadeloupe and inform him on pt's discharge disposition. CSW to continue to follow for possible SNF placement.   Assessment/plan status:  Psychosocial Support/Ongoing Assessment of Needs Other assessment/ plan:   none.   Information/referral to community resources:   [facility] bed offers.    PATIENTS/FAMILYS RESPONSE TO PLAN OF CARE: Pt seemed agreeable to CSW plan of care, but as previously mentioned she also seemed to be pleasantly confused.       Pati Gallo, [place] Social Worker [phone]

## 2013-03-25 NOTE — Progress Notes (Signed)
Orthopedic Tech Progress Note Patient Details:  Janet Frazier 04/26/1930 [id number]  Patient ID: Janet ChickPatricia M Frazier, female   DOB: 05/21/1930, 80 y.o.   MRN: [id number]   Janet Frazier, Janet Frazier 03/25/2013, 10:01 AMTrapeze bar

## 2013-03-25 NOTE — OR Nursing (Signed)
Pt returned to room from CT scan

## 2013-03-25 NOTE — Treatment Plan (Signed)
CT negative for acute fracture. WBAT RLE

## 2013-03-25 NOTE — Progress Notes (Signed)
   Subjective:  Patient reports pain as mild.  No events  Objective:   VITALS:   Filed Vitals:   03/25/13 0500 03/25/13 0800 03/25/13 0856 03/25/13 1200  BP: 124/61  131/47   Pulse: 65  71   Temp: 97.9 F (36.6 C)  98.6 F (37 C)   TempSrc: Oral  Oral   Resp: 20 18 20 18   Height: 5\' 2"  (1.575 m)     Weight: 52.164 kg (115 lb)     SpO2: 96% 94% 94% 94%    Neurologically intact Neurovascular intact Sensation intact distally Intact pulses distally   Lab Results  Component Value Date   WBC 9.6 03/25/2013   HGB 10.8* 03/25/2013   HCT 32.6* 03/25/2013   MCV 86.7 03/25/2013   PLT 238 03/25/2013     Assessment/Plan:     Problem List Items Addressed This Visit     Cardiovascular and Mediastinum   Essential hypertension, benign   Relevant Medications      amLODipine (NORVASC) 5 MG tablet      amLODipine (NORVASC) tablet 5 mg      atenolol (TENORMIN) tablet 25 mg     Endocrine   Diabetes mellitus   Relevant Medications      insulin glargine (LANTUS) 100 UNIT/ML injection      insulin aspart (novoLOG) injection 2 Units (Completed)      insulin glargine (LANTUS) injection 18 Units      insulin aspart (novoLOG) injection 0-9 Units (Start on 03/25/2013  5:00 PM)      insulin aspart (novoLOG) injection 0-5 Units (Start on 03/25/2013 10:00 PM)     Musculoskeletal and Integument   *Intertrochanteric fracture of right femur - Primary     Other   Hypokalemia    Other Visit Diagnoses   Fracture of right distal radius        Fracture of right ulnar styloid        Fall           NPO Plan for surgery of both hip and wrist this pm R/b/a discussed with patient   Cheral AlmasXu, Naiping Michael 03/25/2013, 12:47 PM [phone]

## 2013-03-25 NOTE — H&P (Signed)
H&P update  The surgical history has been reviewed and remains accurate without interval change.  The patient was re-examined and patient's physiologic condition has not changed significantly in the last 30 days. The condition still exists that makes this procedure necessary. The treatment plan remains the same, without new options for care.  No new pharmacological allergies or types of therapy has been initiated that would change the plan or the appropriateness of the plan.  The patient and/or family understand the potential benefits and risks.  Mayra ReelN. Michael Srihan Brutus, MD 03/25/2013 1:17 PM

## 2013-03-26 DIAGNOSIS — W19XXXA Unspecified fall, initial encounter: Secondary | ICD-10-CM

## 2013-03-26 LAB — GLUCOSE, CAPILLARY
Glucose-Capillary: 222 mg/dL — ABNORMAL HIGH (ref 70–99)
Glucose-Capillary: 160 mg/dL — ABNORMAL HIGH (ref 70–99)
Glucose-Capillary: 98 mg/dL (ref 70–99)
Glucose-Capillary: 97 mg/dL (ref 70–99)

## 2013-03-26 LAB — BASIC METABOLIC PANEL
BUN: 15 mg/dL (ref 6–23)
CO2: 28 mEq/L (ref 19–32)
Calcium: 8.3 mg/dL — ABNORMAL LOW (ref 8.4–10.5)
Chloride: 100 mEq/L (ref 96–112)
Creatinine, Ser: 0.45 mg/dL — ABNORMAL LOW (ref 0.50–1.10)
Glucose, Bld: 244 mg/dL — ABNORMAL HIGH (ref 70–99)
POTASSIUM: 4.5 meq/L (ref 3.7–5.3)
SODIUM: 139 meq/L (ref 137–147)

## 2013-03-26 LAB — CBC
HCT: 31.1 % — ABNORMAL LOW (ref 36.0–46.0)
Hemoglobin: 10.5 g/dL — ABNORMAL LOW (ref 12.0–15.0)
MCH: 29.2 pg (ref 26.0–34.0)
MCHC: 33.8 g/dL (ref 30.0–36.0)
MCV: 86.6 fL (ref 78.0–100.0)
PLATELETS: 193 10*3/uL (ref 150–400)
RBC: 3.59 MIL/uL — ABNORMAL LOW (ref 3.87–5.11)
RDW: 13.7 % (ref 11.5–15.5)
WBC: 9.9 10*3/uL (ref 4.0–10.5)

## 2013-03-26 LAB — TYPE AND SCREEN
ABO/RH(D): B POS
Antibody Screen: NEGATIVE

## 2013-03-26 MED ORDER — INSULIN GLARGINE 100 UNIT/ML ~~LOC~~ SOLN
22.0000 [IU] | Freq: Every day | SUBCUTANEOUS | Status: DC
  Administered 2013-03-27 – 2013-03-28 (×2): 22 [IU] via SUBCUTANEOUS
  Filled 2013-03-26 (×2): qty 0.22

## 2013-03-26 MED ORDER — HYDROCODONE-ACETAMINOPHEN 5-325 MG PO TABS: 1.0000 | ORAL_TABLET | Freq: Four times a day (QID) | ORAL | Status: DC | PRN

## 2013-03-26 MED ORDER — GLUCERNA SHAKE PO LIQD
237.0000 mL | ORAL | Status: DC
  Administered 2013-03-27: 237 mL via ORAL

## 2013-03-26 MED ORDER — VITAMIN C 500 MG PO CHEW: 500.0000 mg | CHEWABLE_TABLET | Freq: Two times a day (BID) | ORAL | Status: DC

## 2013-03-26 MED ORDER — ASPIRIN EC 325 MG PO TBEC: 325.0000 mg | DELAYED_RELEASE_TABLET | Freq: Two times a day (BID) | ORAL | Status: DC

## 2013-03-26 NOTE — Progress Notes (Signed)
[facility] PROGRESS NOTE  MAEVE DEBORD ZOX:[id number] DOB: [birth year] DOA: 03/24/2013 PCP: Thayer Headings, MD  Assessment/Plan: Right hip and right distal radius fracture status post mechanical fall -  -surgery done by Dr. Roda Shutters 1/21.  Pain medications are controlling pain  Diabetes mellitus type 2 - -lantus +novolog with meals -SSI- once patient back from surgery and eating  Hypertension -  -amlodipine and atenolol.  -hold Lasix   Anemia - closely follow CBC.   Code Status: DNR Family Communication: patient at bedside Disposition Plan: SNF when bed available   Consultants:  ortho  Procedures:    Antibiotics:    HPI/Subjective: Pain controlled Not very hungry but denies nausea  Objective: Filed Vitals:   03/26/13 1144  BP:   Pulse:   Temp:   Resp: 18    Intake/Output Summary (Last 24 hours) at 03/26/13 1304 Last data filed at 03/25/13 1844  Gross per 24 hour  Intake   1000 ml  Output   1340 ml  Net   -340 ml   Filed Weights   03/25/13 0500  Weight: 52.164 kg (115 lb)    Exam:  General: Well-developed and nourished.  Cardiovascular: S1-S2 heard.  Respiratory: No rhonchi or crepitations, clear Abdomen: Soft nontender bowel sounds present.  Skin: No rash.  Musculoskeletal: arm/leg wrapped   Data Reviewed: Basic Metabolic Panel:  Recent Labs Lab 03/24/13 1808 03/25/13 0446 03/26/13 0558  NA 138 141 139  K 4.0 4.6 4.5  CL 100 102 100  CO2 27 27 28   GLUCOSE 254* 328* 244*  BUN 20 20 15   CREATININE 0.56 0.56 0.45*  CALCIUM 8.2* 8.1* 8.3*   Liver Function Tests:  Recent Labs Lab 03/25/13 0446  AST 15  ALT 12  ALKPHOS 83  BILITOT 0.3  PROT 6.0  ALBUMIN 3.0*   No results found for this basename: LIPASE, AMYLASE,  in the last 168 hours No results found for this basename: AMMONIA,  in the last 168 hours CBC:  Recent Labs Lab 03/24/13 1808 03/25/13 0446 03/26/13 0558  WBC 15.1* 9.6 9.9  NEUTROABS 13.4*  --   --    HGB 11.6* 10.8* 10.5*  HCT 34.4* 32.6* 31.1*  MCV 86.2 86.7 86.6  PLT 264 238 193   Cardiac Enzymes: No results found for this basename: CKTOTAL, CKMB, CKMBINDEX, TROPONINI,  in the last 168 hours BNP (last 3 results) No results found for this basename: PROBNP,  in the last 8760 hours CBG:  Recent Labs Lab 03/25/13 1609 03/25/13 1906 03/25/13 2206 03/26/13 0653 03/26/13 1137  GLUCAP 138* 142* 191* 222* 160*    Recent Results (from the past 240 hour(s))  URINE CULTURE     Status: None   Collection Time    03/25/13  3:33 AM      Result Value Range Status   Specimen Description URINE, CATHETERIZED   Final   Special Requests NONE   Final   Culture  Setup Time     Final   Value: 03/25/2013 04:06     Performed at [facility] Count PENDING   Incomplete   Culture     Final   Value: Culture reincubated for better growth     Performed at [facility]   Report Status PENDING   Incomplete     Studies: Dg Chest 1 View  03/24/2013   CLINICAL DATA:  Fall.  Wrist fracture. Pre-op respiratory exam.  EXAM: CHEST - 1 VIEW  COMPARISON:  11/12/2012  FINDINGS: Low lung volumes again noted. Mild cardiomegaly is stable. No evidence of acute infiltrate or edema. Mild elevation of left hemidiaphragm and left basilar scarring is unchanged. Old left posterior rib fractures again noted.  IMPRESSION: No acute findings.   Electronically Signed   By: Myles RosenthalJohn  Stahl M.D.   On: 03/24/2013 20:20   Dg Shoulder Right  03/24/2013   CLINICAL DATA:  Fall.  Right shoulder injury and pain.  EXAM: RIGHT SHOULDER - 2+ VIEW  COMPARISON:  None.  FINDINGS: There is no evidence of fracture or dislocation. Diffuse osteopenia noted. No focal bone lesions identified. Mild soft tissue calcification noted.  IMPRESSION: No acute findings.  Osteopenia.   Electronically Signed   By: Myles RosenthalJohn  Stahl M.D.   On: 03/24/2013 20:21   Dg Wrist 2 Views Right  03/25/2013   CLINICAL DATA:  ORIF right wrist  EXAM:  RIGHT WRIST - 2 VIEW  COMPARISON:  Earlier today  FINDINGS: Portable C-arm radiographs obtained in the operating room demonstrate postoperative changes compatible with open reduction and internal fixation of distal radius fracture. The hardware components and fracture fragments are in anatomic alignment. Minimally displaced ulnar styloid fracture is noted.  IMPRESSION: 1. Status post ORIF of distal radius fracture.   Electronically Signed   By: Signa Kellaylor  Stroud M.D.   On: 03/25/2013 19:26   Dg Wrist Complete Right  03/24/2013   CLINICAL DATA:  Fall.  Right wrist pain and fracture deformity pre  EXAM: RIGHT WRIST - COMPLETE 3+ VIEW  COMPARISON:  None.  FINDINGS: Diffuse osteopenia noted. Highly comminuted and impacted fracture of the distal radius seen, with involvement of both the distal radial ulnar and radiocarpal joints. Dorsal displacement and angulation of the distal articular surface of the radius is seen. Associated fracture of the ulnar styloid process also noted. Carpal bones remain normal in alignment.  IMPRESSION: Highly comminuted and impacted fracture of the distal radius, with dorsal displacement angulation.  Ulnar styloid process fracture.  Osteopenia.   Electronically Signed   By: Myles RosenthalJohn  Stahl M.D.   On: 03/24/2013 20:19   Dg Hip Complete Right  03/24/2013   CLINICAL DATA:  Fall.  Right hip injury and pain.  EXAM: RIGHT HIP - COMPLETE 2+ VIEW  COMPARISON:  None.  FINDINGS: Intertrochanteric right hip fracture is seen with varus angulation. No evidence of hip dislocation.  Diffuse osteopenia noted. Old left pubic rami fractures demonstrated as well as peripheral vascular calcification.  IMPRESSION: Intertrochanteric right hip fracture.  Osteopenia.   Electronically Signed   By: Myles RosenthalJohn  Stahl M.D.   On: 03/24/2013 20:22   Dg Hip Operative Right  03/25/2013   CLINICAL DATA:  Fracture  EXAM: DG OPERATIVE RIGHT HIP  TECHNIQUE: A single spot fluoroscopic AP image of the right hip is submitted.   COMPARISON:  DG FEMUR*R*PORT dated 03/25/2013; DG C-ARM 1-60 MIN dated 03/25/2013  FINDINGS: Right intra medullary rod and dynamic compression screws have been placed across an intertrochanteric right femur fracture. There is anatomic alignment. No breakage or loosening of the hardware.  IMPRESSION: ORIF right intertrochanteric femur fracture.   Electronically Signed   By: Maryclare BeanArt  Hoss M.D.   On: 03/25/2013 19:43   Ct Knee Right Wo Contrast  03/25/2013   CLINICAL DATA:  Lateral tibial plateau fracture of unknown age.  EXAM: CT OF THE RIGHT KNEE WITHOUT CONTRAST  TECHNIQUE: Multidetector CT imaging was performed according to the standard protocol. Multiplanar CT image reconstructions were also generated.  COMPARISON:  Radiographs dated 03/25/2013  FINDINGS: There is an old depressed fracture of the lateral tibial plateau. There is no evidence of an acute fracture. There is a small joint effusion. The tibial plateau is depressed approximately 4.6 mm. There is medial joint space narrowing. Distal femur is intact. Proximal fibula is intact. There are slight degenerative changes of the patella. There is chronic thickening of the patellar tendon.  IMPRESSION: Old slightly depressed fracture of the lateral tibial plateau. Small joint effusion. Chronic degenerative changes of the patellar tendon.   Electronically Signed   By: Geanie Cooley M.D.   On: 03/25/2013 21:21   Dg Femur Right Port  03/25/2013   CLINICAL DATA:  Proximal right femur fracture.  EXAM: PORTABLE RIGHT FEMUR - 2 VIEW  COMPARISON:  Radiographs dated 03/24/2013  FINDINGS: Again noted is the comminuted intertrochanteric fracture of the proximal right femur, angulated. There is diffuse osteopenia.  There is a depressed fracture of the right lateral tibial plateau, age indeterminate. Does the patient currently have right knee pain?  IMPRESSION: 1. No change in the comminuted intertrochanteric fracture of the proximal right femur. 2. Depressed fracture lateral  tibial plateau, age indeterminate.   Electronically Signed   By: Geanie Cooley M.D.   On: 03/25/2013 14:22   Dg C-arm 1-60 Min  03/25/2013   : See accession [id number]   Electronically Signed   By: Maryclare Bean M.D.   On: 03/25/2013 19:45    Scheduled Meds: . amLODipine  5 mg Oral Daily  . aspirin EC  325 mg Oral BID  . atenolol  25 mg Oral Daily  . cholecalciferol  2,000 Units Oral Daily  . feeding supplement (GLUCERNA SHAKE)  237 mL Oral Q24H  . ferrous fumarate  1 tablet Oral QODAY  . insulin aspart  0-5 Units Subcutaneous QHS  . insulin aspart  0-9 Units Subcutaneous TID WC  . [START ON 03/27/2013] insulin glargine  22 Units Subcutaneous QAC breakfast  . pantoprazole  40 mg Oral Daily  . senna  1 tablet Oral BID   Continuous Infusions: . sodium chloride 125 mL/hr at 03/25/13 2319    Principal Problem:   Intertrochanteric fracture of right femur Active Problems:   Essential hypertension, benign   Diabetes mellitus   Closed right hip fracture   Hip fracture    Time spent: 25 min    Benjamine Mola JESSICA  [facility] Pager [phone] If 7PM-7AM, please contact night-coverage at [email or web address], password [facility] 03/26/2013, 1:04 PM  LOS: 2 days

## 2013-03-26 NOTE — Progress Notes (Signed)
Inpatient Diabetes Program Recommendations  AACE/ADA: New Consensus Statement on Inpatient Glycemic Control (2013)  Target Ranges:  Prepandial:   less than 140 mg/dL      Peak postprandial:   less than 180 mg/dL (1-2 hours)      Critically ill patients:  140 - 180 mg/dL   Reason for Visit: Hyperglycemia  Results for Janet Frazier, Janet Frazier (MRN [id number]) as of 03/26/2013 10:42  Ref. Range 03/26/2013 05:58  Sodium Latest Range: 137-147 mEq/L 139  Potassium Latest Range: 3.7-5.3 mEq/L 4.5  Chloride Latest Range: 96-112 mEq/L 100  CO2 Latest Range: 19-32 mEq/L 28  BUN Latest Range: 6-23 mg/dL 15  Creatinine Latest Range: 0.50-1.10 mg/dL 4.090.45 (L)  Calcium Latest Range: 8.4-10.5 mg/dL 8.3 (L)  GFR calc non Af Amer Latest Range: >90 mL/min >90  GFR calc Af Amer Latest Range: >90 mL/min >90  Glucose Latest Range: 70-99 mg/dL 811244 (H)  Results for Janet Frazier, [name] (MRN [id number]) as of 03/26/2013 10:42  Ref. Range 03/25/2013 19:06 03/25/2013 22:06 03/26/2013 06:53  Glucose-Capillary Latest Range: 70-99 mg/dL 213142 (H) 086191 (H) 578222 (H)   FBS elevated this am. May need slight increase in basal insulin - recommend Lantus 22 units QAM. If pt is eating >50% meals, recommend addition of meal coverage insulin - Novolog 3 units tidwc.  Will continue to follow. Thank you. Ailene Ardshonda Shakema Surita, RD, LDN, CDE Inpatient Diabetes Coordinator [phone]

## 2013-03-26 NOTE — Discharge Instructions (Signed)
1. Leave splint on until follow up appointment 2. Change hip dressings as needed 3. May shower after postoperative day 10

## 2013-03-26 NOTE — Progress Notes (Signed)
Orthopedic Tech Progress Note Patient Details:  Janet Frazier 05/08/1930 [id number] Unable to use ohf  Patient ID: Janet ChickPatricia M Frazier, female   DOB: 07/28/1930, 80 y.o.   MRN: [id number]   [name], Francoise Chojnowski Craig 03/26/2013, 6:54 PM

## 2013-03-26 NOTE — Evaluation (Signed)
Physical Therapy Evaluation Patient Details Name: Janet Frazier Ines MRN: [id number] DOB: 09/10/1930 Today's Date: 03/26/2013 Time: [id number] PT Time Calculation (min): 22 min  PT Assessment / Plan / Recommendation History of Present Illness  pt presents following a fall resulting in R femur fx s/p IM nail and R radius fx s/p ORIF.    Clinical Impression  Pt painful with mobility and fearful of falling.  Pt needs encouragement for participation.  Spoke with RN about need for pain meds and feel pt may participate better with transfers.  Will continue to follow.      PT Assessment  Patient needs continued PT services    Follow Up Recommendations  SNF    Does the patient have the potential to tolerate intense rehabilitation      Barriers to Discharge        Equipment Recommendations  None recommended by PT    Recommendations for Other Services     Frequency Min 3X/week    Precautions / Restrictions Precautions Precautions: Fall Restrictions Weight Bearing Restrictions: Yes RUE Weight Bearing: Weight bear through elbow only RLE Weight Bearing: Weight bearing as tolerated   Pertinent Vitals/Pain Did not rate, but yells out during mobility.        Mobility  Bed Mobility Overal bed mobility: Needs Assistance;+2 for physical assistance Bed Mobility: Supine to Sit Supine to sit: Total assist;+2 for physical assistance General bed mobility comments: cues for safe technique and encouragement.   Transfers Overall transfer level: Needs assistance Equipment used: None Transfers: Squat Pivot Transfers Squat pivot transfers: Total assist;+2 physical assistance General transfer comment: pt not A with coming to stand or during pivot.  pt fearful.      Exercises     PT Diagnosis: Difficulty walking;Acute pain  PT Problem List: Decreased strength;Decreased activity tolerance;Decreased balance;Decreased mobility;Decreased cognition;Decreased knowledge of use of DME;Decreased safety  awareness;Decreased knowledge of precautions;Pain PT Treatment Interventions: DME instruction;Gait training;Functional mobility training;Therapeutic activities;Therapeutic exercise;Balance training;Patient/family education     PT Goals(Current goals can be found in the care plan section) Acute Rehab PT Goals Patient Stated Goal: None stated.   PT Goal Formulation: With patient Time For Goal Achievement: 04/09/13 Potential to Achieve Goals: Fair  Visit Information  Last PT Received On: 03/26/13 Assistance Needed: +2 History of Present Illness: pt presents following a fall resulting in R femur fx s/p IM nail and R radius fx s/p ORIF.         Prior Functioning  Home Living Family/patient expects to be discharged to:: Skilled nursing facility Prior Function Comments: PLOF unclear as pt confused.   Communication Communication: No difficulties    Cognition  Cognition Arousal/Alertness: Awake/alert Behavior During Therapy: WFL for tasks assessed/performed Overall Cognitive Status: Impaired/Different from baseline Area of Impairment: Orientation;Attention;Memory;Following commands;Safety/judgement;Awareness;Problem solving Orientation Level: Disoriented to;Place;Time;Situation Current Attention Level: Focused Memory: Decreased recall of precautions;Decreased short-term memory Following Commands: Follows one step commands inconsistently Safety/Judgement: Decreased awareness of safety;Decreased awareness of deficits Awareness: Emergent;Anticipatory Problem Solving: Slow processing;Difficulty sequencing;Requires verbal cues;Requires tactile cues;Decreased initiation General Comments: No family present to determine baseline.      Extremity/Trunk Assessment Upper Extremity Assessment Upper Extremity Assessment: Defer to OT evaluation Lower Extremity Assessment Lower Extremity Assessment: RLE deficits/detail;Generalized weakness RLE Deficits / Details: pt very painful in R LE and back  preventing full assessment.   RLE: Unable to fully assess due to pain   Balance    End of Session PT - End of Session Equipment Utilized During Treatment: Gait belt Activity Tolerance:  Patient limited by pain Patient left: in chair;with call bell/phone within reach;with chair alarm set Nurse Communication: Mobility status  GP     Sunny Schlein, [place] [phone] 03/26/2013, 10:27 AM

## 2013-03-26 NOTE — Progress Notes (Addendum)
Subjective:  Patient reports pain as mild.    Objective:   VITALS:   Filed Vitals:   03/26/13 0144 03/26/13 0400 03/26/13 0533 03/26/13 0741  BP: 137/69  133/70   Pulse: 75  73   Temp: 97.9 F (36.6 C)  97.2 F (36.2 C)   TempSrc: Oral  Oral   Resp: $Remo'17 17 20 18  'hOQBO$ Height:      Weight:      SpO2: 93% 94% 100% 96%    Neurologically intact Neurovascular intact Sensation intact distally Intact pulses distally Dorsiflexion/Plantar flexion intact Incision: dressing C/D/I and no drainage No cellulitis present Compartment soft RUE splint in place Fingers wwp and nvi No signs of CTS   Lab Results  Component Value Date   WBC 9.9 03/26/2013   HGB 10.5* 03/26/2013   HCT 31.1* 03/26/2013   MCV 86.6 03/26/2013   PLT 193 03/26/2013     Assessment/Plan: 1 Day Post-Op   Problem List Items Addressed This Visit     Cardiovascular and Mediastinum   Essential hypertension, benign   Relevant Medications      amLODipine (NORVASC) 5 MG tablet      amLODipine (NORVASC) tablet 5 mg      atenolol (TENORMIN) tablet 25 mg      aspirin EC tablet 325 mg      aspirin EC tablet      aspirin EC tablet     Endocrine   Diabetes mellitus   Relevant Medications      insulin glargine (LANTUS) 100 UNIT/ML injection      insulin aspart (novoLOG) injection 2 Units (Completed)      insulin glargine (LANTUS) injection 18 Units      insulin aspart (novoLOG) injection 0-9 Units      insulin aspart (novoLOG) injection 0-5 Units     Musculoskeletal and Integument   *Intertrochanteric fracture of right femur - Primary   Relevant Orders      Weight bearing as tolerated     Other   Hypokalemia    Other Visit Diagnoses   Fracture of right distal radius        Fracture of right ulnar styloid        Fall           Advance diet Up with PT/OT DVT ppx - SCDs, ambulation, asa WBAT RLE Platform WB RUE CT neg for acute fracture Pain control - Based on history and fracture pattern this  likely represents a fragility fracture. - Fragility fractures affect up to one half of women and one third of men after age 77 years and occur in the setting of bone disorder such as osteoporosis or osteopenia and warrant appropriate work-up. - The following are general recommendations that may serve as an outline for an appropriate work-up:  1.) Obtain bone density measurement to confirm presumptive diagnosis, assess severity of osteoporosis and risk of future fracture, and use as baseline for monitoring treatment  2.) Obtain laboratory tests: CBC, ESR, serum calcium, creatinine, albumin,phosphate, alkaline phosphatase, liver transaminases, protein electrophoresis, urinalysis, 25-hydroxyvitamin D.  3.) Exclude secondary causes of low bone mass and skeletal fragility (eg,multiple myeloma, lymphoma) as indicated.  4.) Obtain radiograph of thoracic and lumbar spine, particularly among individuals with back pain or height loss to assess presence of vertebral fractures  5.) Intermittent administration of recombinant human parathyroid hormone  6.) Optimize nutritional status using nutritional supplementation.  7.) Patient/family education to prevent future falls.  8.) Early mobilization and exercise program -  exercise decreases the rate of bone loss and has been associated with decreased rate of fragility fractures    Marianna Payment 03/26/2013, 8:09 AM [phone]

## 2013-03-26 NOTE — Clinical Social Work Placement (Signed)
Clinical Social Work Department CLINICAL SOCIAL WORK PLACEMENT NOTE 03/26/2013  Patient:  Janet Frazier,Janet Frazier  Account Number:  [id number] Admit date:  03/24/2013  Clinical Social Worker:  Irving BurtonEMILY SUMMERVILLE, LCSWA  Date/time:  03/26/2013 03:05 PM  Clinical Social Work is seeking post-discharge placement for this patient at the following level of care:   SKILLED NURSING   (*CSW will update this form in Epic as items are completed)   03/26/2013  [facility]family provided with [facility] of Clinical Social Works list of facilities offering this level of care within the geographic area requested by the patient (or if unable, by the patients family).  03/26/2013  Patient/family informed of their freedom to choose among providers that offer the needed level of care, that participate in Medicare, Medicaid or managed care program needed by the patient, have an available bed and are willing to accept the patient.  03/26/2013  Patient/family informed of [facility] ownership interest in [facility], as well as of the fact that they are under no obligation to receive care at this facility.  PASARR submitted to EDS on 03/26/2013 PASARR number received from [employer] on 03/26/2013  FL2 transmitted to all facilities in geographic area requested by pt/family on  03/26/2013 FL2 transmitted to all facilities within larger geographic area on   Patient informed that his/her managed care company has contracts with or will negotiate with  certain facilities, including the following:     Patient/family informed of bed offers received:   Patient chooses bed at  Physician recommends and patient chooses bed at    Patient to be transferred to  on   Patient to be transferred to facility by   The following physician request were entered in Epic:   Additional Comments:  Darlyn ChamberEmily Summerville, Theresia MajorsLCSWA Clinical Social Worker [phone]

## 2013-03-27 ENCOUNTER — Encounter (HOSPITAL_COMMUNITY): Payer: Self-pay | Admitting: Orthopaedic Surgery

## 2013-03-27 DIAGNOSIS — S72009A Fracture of unspecified part of neck of unspecified femur, initial encounter for closed fracture: Secondary | ICD-10-CM

## 2013-03-27 LAB — BASIC METABOLIC PANEL
BUN: 11 mg/dL (ref 6–23)
CHLORIDE: 98 meq/L (ref 96–112)
CO2: 30 meq/L (ref 19–32)
Calcium: 8.1 mg/dL — ABNORMAL LOW (ref 8.4–10.5)
Creatinine, Ser: 0.49 mg/dL — ABNORMAL LOW (ref 0.50–1.10)
GFR calc Af Amer: >90 mL/min (ref >90)
GFR calc non Af Amer: 90 mL/min — ABNORMAL LOW (ref >90)
Glucose, Bld: 80 mg/dL (ref 70–99)
Potassium: 3.7 mEq/L (ref 3.7–5.3)
Sodium: 139 mEq/L (ref 137–147)

## 2013-03-27 LAB — CBC
HCT: 27.2 % — ABNORMAL LOW (ref 36.0–46.0)
HEMOGLOBIN: 8.9 g/dL — AB (ref 12.0–15.0)
MCH: 28.5 pg (ref 26.0–34.0)
MCHC: 32.7 g/dL (ref 30.0–36.0)
MCV: 87.2 fL (ref 78.0–100.0)
PLATELETS: 195 10*3/uL (ref 150–400)
RBC: 3.12 MIL/uL — AB (ref 3.87–5.11)
RDW: 13.8 % (ref 11.5–15.5)
WBC: 8.8 10*3/uL (ref 4.0–10.5)

## 2013-03-27 LAB — GLUCOSE, CAPILLARY
GLUCOSE-CAPILLARY: 135 mg/dL — AB (ref 70–99)
Glucose-Capillary: 89 mg/dL (ref 70–99)
Glucose-Capillary: 145 mg/dL — ABNORMAL HIGH (ref 70–99)
Glucose-Capillary: 140 mg/dL — ABNORMAL HIGH (ref 70–99)

## 2013-03-27 MED ORDER — INSULIN ASPART 100 UNIT/ML ~~LOC~~ SOLN: 0.0000 [IU] | Freq: Three times a day (TID) | SUBCUTANEOUS | Status: DC

## 2013-03-27 MED ORDER — CIPROFLOXACIN HCL 500 MG PO TABS
500.0000 mg | ORAL_TABLET | Freq: Two times a day (BID) | ORAL | Status: DC
  Administered 2013-03-27 – 2013-03-28 (×3): 500 mg via ORAL
  Filled 2013-03-27 (×6): qty 1

## 2013-03-27 MED ORDER — POLYETHYLENE GLYCOL 3350 17 G PO PACK: 17.0000 g | PACK | Freq: Every day | ORAL | Status: DC | PRN

## 2013-03-27 MED ORDER — INSULIN GLARGINE 100 UNIT/ML ~~LOC~~ SOLN: 22.0000 [IU] | Freq: Every day | SUBCUTANEOUS | Status: DC

## 2013-03-27 MED ORDER — GLUCERNA SHAKE PO LIQD: 237.0000 mL | ORAL | Status: DC

## 2013-03-27 MED ORDER — INSULIN ASPART 100 UNIT/ML ~~LOC~~ SOLN: 0.0000 [IU] | Freq: Every day | SUBCUTANEOUS | Status: DC

## 2013-03-27 MED ORDER — ONDANSETRON HCL 4 MG PO TABS: 4.0000 mg | ORAL_TABLET | Freq: Four times a day (QID) | ORAL | Status: DC | PRN

## 2013-03-27 NOTE — Progress Notes (Signed)
Subjective:  Patient reports pain as mild.    Objective:   VITALS:   Filed Vitals:   03/26/13 1937 03/26/13 2142 03/27/13 0149 03/27/13 0522  BP: 111/31 135/37 129/46 125/43  Pulse: 68 73 71 78  Temp: 98.5 F (36.9 C) 99.1 F (37.3 C) 98.5 F (36.9 C) 98.1 F (36.7 C)  TempSrc: Oral Oral Oral Oral  Resp: $Remo'20 18 16 20  'uqOUZ$ Height:      Weight:      SpO2: 95% 95% 98% 94%    Neurologically intact Neurovascular intact Sensation intact distally Intact pulses distally Dorsiflexion/Plantar flexion intact Incision: dressing C/D/I and no drainage No cellulitis present Compartment soft RUE splint in place Fingers wwp and nvi No signs of CTS   Lab Results  Component Value Date   WBC 8.8 03/27/2013   HGB 8.9* 03/27/2013   HCT 27.2* 03/27/2013   MCV 87.2 03/27/2013   PLT 195 03/27/2013     Assessment/Plan: 2 Days Post-Op   Problem List Items Addressed This Visit     Cardiovascular and Mediastinum   Essential hypertension, benign   Relevant Medications      amLODipine (NORVASC) 5 MG tablet      amLODipine (NORVASC) tablet 5 mg      atenolol (TENORMIN) tablet 25 mg      aspirin EC tablet 325 mg      aspirin EC tablet      aspirin EC tablet     Endocrine   Diabetes mellitus   Relevant Medications      insulin glargine (LANTUS) 100 UNIT/ML injection      insulin aspart (novoLOG) injection 2 Units (Completed)      insulin aspart (novoLOG) injection 0-9 Units      insulin aspart (novoLOG) injection 0-5 Units      insulin glargine (LANTUS) injection 22 Units     Musculoskeletal and Integument   *Intertrochanteric fracture of right femur - Primary   Relevant Orders      Weight bearing as tolerated     Other   Hypokalemia    Other Visit Diagnoses   Fracture of right distal radius        Fracture of right ulnar styloid        Fall          Hand exercises left in chart Advance diet Up with PT/OT DVT ppx - SCDs, ambulation, asa WBAT RLE Platform WB RUE CT neg  for acute fracture Pain control - Based on history and fracture pattern this likely represents a fragility fracture. - Fragility fractures affect up to one half of women and one third of men after age 40 years and occur in the setting of bone disorder such as osteoporosis or osteopenia and warrant appropriate work-up. - The following are general recommendations that may serve as an outline for an appropriate work-up:  1.) Obtain bone density measurement to confirm presumptive diagnosis, assess severity of osteoporosis and risk of future fracture, and use as baseline for monitoring treatment  2.) Obtain laboratory tests: CBC, ESR, serum calcium, creatinine, albumin,phosphate, alkaline phosphatase, liver transaminases, protein electrophoresis, urinalysis, 25-hydroxyvitamin D.  3.) Exclude secondary causes of low bone mass and skeletal fragility (eg,multiple myeloma, lymphoma) as indicated.  4.) Obtain radiograph of thoracic and lumbar spine, particularly among individuals with back pain or height loss to assess presence of vertebral fractures  5.) Intermittent administration of recombinant human parathyroid hormone  6.) Optimize nutritional status using nutritional supplementation.  7.) Patient/family education to prevent  future falls.  8.) Early mobilization and exercise program - exercise decreases the rate of bone loss and has been associated with decreased rate of fragility fractures    Marianna Payment 03/27/2013, 8:17 AM [phone]

## 2013-03-27 NOTE — Progress Notes (Signed)
[facility] PROGRESS NOTE  Janet Frazier ZOX:[id number] DOB: 02/24/1931 DOA: 03/24/2013 PCP: Thayer HeadingsMACKENZIE,BRIAN, MD  Assessment/Plan: Right hip and right distal radius fracture status post mechanical fall -  -surgery done by Dr. Roda ShuttersXu 1/21.  Pain medications are controlling pain  Diabetes mellitus type 2 - -lantus +novolog with meals -SSI- once patient back from surgery and eating  Hypertension -  -amlodipine and atenolol.  -hold Lasix   Anemia - closely follow CBC.   Code Status: full Family Communication: patient at bedside Disposition Plan: SNF in AM   Consultants:  ortho  Procedures:    Antibiotics:    HPI/Subjective: Patient with no c/o today   Objective: Filed Vitals:   03/27/13 0522  BP: 125/43  Pulse: 78  Temp: 98.1 F (36.7 C)  Resp: 20    Intake/Output Summary (Last 24 hours) at 03/27/13 1006 Last data filed at 03/27/13 0000  Gross per 24 hour  Intake 3027.92 ml  Output    950 ml  Net 2077.92 ml   Filed Weights   03/25/13 0500  Weight: 52.164 kg (115 lb)    Exam:  General: Well-developed and nourished.  Cardiovascular: S1-S2 heard.  Respiratory: No rhonchi or crepitations, clear Abdomen: Soft nontender bowel sounds present.  Musculoskeletal: arm/leg wrapped   Data Reviewed: Basic Metabolic Panel:  Recent Labs Lab 03/24/13 1808 03/25/13 0446 03/26/13 0558 03/27/13 0540  NA 138 141 139 139  K 4.0 4.6 4.5 3.7  CL 100 102 100 98  CO2 27 27 28 30   GLUCOSE 254* 328* 244* 80  BUN 20 20 15 11   CREATININE 0.56 0.56 0.45* 0.49*  CALCIUM 8.2* 8.1* 8.3* 8.1*   Liver Function Tests:  Recent Labs Lab 03/25/13 0446  AST 15  ALT 12  ALKPHOS 83  BILITOT 0.3  PROT 6.0  ALBUMIN 3.0*   No results found for this basename: LIPASE, AMYLASE,  in the last 168 hours No results found for this basename: AMMONIA,  in the last 168 hours CBC:  Recent Labs Lab 03/24/13 1808 03/25/13 0446 03/26/13 0558 03/27/13 0540  WBC 15.1*  9.6 9.9 8.8  NEUTROABS 13.4*  --   --   --   HGB 11.6* 10.8* 10.5* 8.9*  HCT 34.4* 32.6* 31.1* 27.2*  MCV 86.2 86.7 86.6 87.2  PLT 264 238 193 195   Cardiac Enzymes: No results found for this basename: CKTOTAL, CKMB, CKMBINDEX, TROPONINI,  in the last 168 hours BNP (last 3 results) No results found for this basename: PROBNP,  in the last 8760 hours CBG:  Recent Labs Lab 03/26/13 0653 03/26/13 1137 03/26/13 1706 03/26/13 2141 03/27/13 0644  GLUCAP 222* 160* 98 97 89    Recent Results (from the past 240 hour(s))  URINE CULTURE     Status: None   Collection Time    03/25/13  3:33 AM      Result Value Range Status   Specimen Description URINE, CATHETERIZED   Final   Special Requests NONE   Final   Culture  Setup Time     Final   Value: 03/25/2013 04:06     Performed at [facility]   Colony Count     Final   Value: >=100,000 COLONIES/ML     [facility]formed at [facility]   Culture     Final   Value: ENTEROCOCCUS SPECIES     VIRIDANS STREPTOCOCCUS     Performed at [facility]   Report Status PENDING   Incomplete  Studies: Dg Wrist 2 Views Right  2013/03/26   CLINICAL DATA:  ORIF right wrist  EXAM: RIGHT WRIST - 2 VIEW  COMPARISON:  Earlier today  FINDINGS: Portable C-arm radiographs obtained in the operating room demonstrate postoperative changes compatible with open reduction and internal fixation of distal radius fracture. The hardware components and fracture fragments are in anatomic alignment. Minimally displaced ulnar styloid fracture is noted.  IMPRESSION: 1. Status post ORIF of distal radius fracture.   Electronically Signed   By: Signa Kell M.D.   On: 2013-03-26 19:26   Dg Hip Operative Right  2013/03/26   CLINICAL DATA:  Fracture  EXAM: DG OPERATIVE RIGHT HIP  TECHNIQUE: A single spot fluoroscopic AP image of the right hip is submitted.  COMPARISON:  DG FEMUR*R*PORT dated 2013-03-26; DG C-ARM 1-60 MIN dated Mar 26, 2013  FINDINGS: Right intra  medullary rod and dynamic compression screws have been placed across an intertrochanteric right femur fracture. There is anatomic alignment. No breakage or loosening of the hardware.  IMPRESSION: ORIF right intertrochanteric femur fracture.   Electronically Signed   By: Maryclare Bean M.D.   On: 03/26/2013 19:43   Ct Knee Right Wo Contrast  03/26/13   CLINICAL DATA:  Lateral tibial plateau fracture of unknown age.  EXAM: CT OF THE RIGHT KNEE WITHOUT CONTRAST  TECHNIQUE: Multidetector CT imaging was performed according to the standard protocol. Multiplanar CT image reconstructions were also generated.  COMPARISON:  Radiographs dated 2013-03-26  FINDINGS: There is an old depressed fracture of the lateral tibial plateau. There is no evidence of an acute fracture. There is a small joint effusion. The tibial plateau is depressed approximately 4.6 mm. There is medial joint space narrowing. Distal femur is intact. Proximal fibula is intact. There are slight degenerative changes of the patella. There is chronic thickening of the patellar tendon.  IMPRESSION: Old slightly depressed fracture of the lateral tibial plateau. Small joint effusion. Chronic degenerative changes of the patellar tendon.   Electronically Signed   By: Geanie Cooley M.D.   On: 2013-03-26 21:21   Dg Femur Right Port  Mar 26, 2013   CLINICAL DATA:  Proximal right femur fracture.  EXAM: PORTABLE RIGHT FEMUR - 2 VIEW  COMPARISON:  Radiographs dated 03/24/2013  FINDINGS: Again noted is the comminuted intertrochanteric fracture of the proximal right femur, angulated. There is diffuse osteopenia.  There is a depressed fracture of the right lateral tibial plateau, age indeterminate. Does the patient currently have right knee pain?  IMPRESSION: 1. No change in the comminuted intertrochanteric fracture of the proximal right femur. 2. Depressed fracture lateral tibial plateau, age indeterminate.   Electronically Signed   By: Geanie Cooley M.D.   On: March 26, 2013  14:22   Dg C-arm 1-60 Min  March 26, 2013   : See accession [id number]   Electronically Signed   By: Maryclare Bean M.D.   On: 03/26/2013 19:45    Scheduled Meds: . amLODipine  5 mg Oral Daily  . aspirin EC  325 mg Oral BID  . atenolol  25 mg Oral Daily  . cholecalciferol  2,000 Units Oral Daily  . feeding supplement (GLUCERNA SHAKE)  237 mL Oral Q24H  . ferrous fumarate  1 tablet Oral QODAY  . insulin aspart  0-5 Units Subcutaneous QHS  . insulin aspart  0-9 Units Subcutaneous TID WC  . insulin glargine  22 Units Subcutaneous QAC breakfast  . pantoprazole  40 mg Oral Daily  . senna  1 tablet Oral BID   Continuous  Infusions:    Principal Problem:   Intertrochanteric fracture of right femur Active Problems:   Essential hypertension, benign   Diabetes mellitus   Closed right hip fracture   Hip fracture    Time spent: 25 min    Gwendolyne Welford  [facility] Pager [phone] If 7PM-7AM, please contact night-coverage at [email or web address], password [facility] 03/27/2013, 10:06 AM  LOS: 3 days

## 2013-03-27 NOTE — Evaluation (Signed)
Occupational Therapy Evaluation Patient Details Name: Janet Frazier MRN: [id number] DOB: September 27, 1930 Today's Date: 03/27/2013 Time: 1191-4782 OT Time Calculation (min): 15 min  OT Assessment / Plan / Recommendation History of present illness pt presents following a fall resulting in R femur fx s/p IM nail and R radius fx s/p ORIF.     Clinical Impression   Pt admitted with above. Will continue to follow pt acutely in order to address below problem list. Recommend SNF for d/c planning.    OT Assessment  Patient needs continued OT Services    Follow Up Recommendations  SNF    Barriers to Discharge      Equipment Recommendations   (TBD at SNF)    Recommendations for Other Services    Frequency  Min 2X/week    Precautions / Restrictions Precautions Precautions: Fall Restrictions Weight Bearing Restrictions: Yes RUE Weight Bearing: Weight bear through elbow only RLE Weight Bearing: Weight bearing as tolerated   Pertinent Vitals/Pain See vitals    ADL  Grooming: Performed;Wash/dry hands;Supervision/safety Where Assessed - Grooming: Supported sitting Upper Body Bathing: Simulated;Minimal assistance Where Assessed - Upper Body Bathing: Supported sitting Lower Body Bathing: Simulated;Maximal assistance Where Assessed - Lower Body Bathing: Supported sitting Upper Body Dressing: Simulated;Minimal assistance Where Assessed - Upper Body Dressing: Supported sitting Lower Body Dressing: Performed;+1 Total assistance Where Assessed - Lower Body Dressing: Supported sitting ADL Comments: Pt resting in bed on OT arrival.  Required max encouragement to sit up EOB but requesting to lie back down despite encouragement to participate in mobility.     OT Diagnosis: Generalized weakness;Acute pain;Cognitive deficits  OT Problem List: Decreased strength;Decreased activity tolerance;Impaired balance (sitting and/or standing);Pain;Impaired UE functional use;Decreased knowledge of use of DME or  AE;Decreased safety awareness;Decreased cognition OT Treatment Interventions: Self-care/ADL training;DME and/or AE instruction;Therapeutic activities;Cognitive remediation/compensation;Patient/family education;Balance training   OT Goals(Current goals can be found in the care plan section) Acute Rehab OT Goals Patient Stated Goal: None stated.   OT Goal Formulation: With patient Time For Goal Achievement: 04/03/13 Potential to Achieve Goals: Good  Visit Information  Last OT Received On: 03/27/13 Assistance Needed: +2 History of Present Illness: pt presents following a fall resulting in R femur fx s/p IM nail and R radius fx s/p ORIF.         Prior Functioning     Home Living Family/patient expects to be discharged to:: Skilled nursing facility Prior Function Level of Independence:  (Unclear per pt report, no family available)         Vision/Perception     Cognition  Cognition Arousal/Alertness: Awake/alert Behavior During Therapy: WFL for tasks assessed/performed Overall Cognitive Status: Impaired/Different from baseline Area of Impairment: Following commands;Safety/judgement;Awareness Following Commands: Follows one step commands inconsistently Safety/Judgement: Decreased awareness of safety;Decreased awareness of deficits Awareness: Emergent Problem Solving: Slow processing;Difficulty sequencing;Requires verbal cues;Requires tactile cues;Decreased initiation General Comments: No family present to determine baseline.      Extremity/Trunk Assessment Upper Extremity Assessment Upper Extremity Assessment: RUE deficits/detail RUE Deficits / Details: Radial ORIF.      Mobility Bed Mobility Overal bed mobility: Needs Assistance Bed Mobility: Supine to Sit;Sit to Supine Supine to sit: Max assist;HOB elevated Sit to supine: Max assist General bed mobility comments: Use of draw pad and elevated HOB to bring trunk and LEs OOB.     Exercise     Balance  Balance Overall balance assessment: Needs assistance Sitting-balance support: Feet supported;No upper extremity supported Sitting balance-Leahy Scale: Poor   End of Session OT -  End of Session Activity Tolerance: Patient limited by fatigue Patient left: in bed;with call bell/phone within reach  GO    03/27/2013 Cipriano MileJohnson, Jenna Elizabeth OTR/L Pager [phone] Office [phone]  Cipriano MileJohnson, Jenna Elizabeth 03/27/2013, 2:56 PM

## 2013-03-27 NOTE — Clinical Social Work Placement (Signed)
Clinical Social Work Department CLINICAL SOCIAL WORK PLACEMENT NOTE 03/27/2013  Patient:  Janet Frazier,Janet Frazier  Account Number:  [id number] Admit date:  03/24/2013  Clinical Social Worker:  Irving BurtonEMILY SUMMERVILLE, LCSWA  Date/time:  03/26/2013 03:05 PM  Clinical Social Work is seeking post-discharge placement for this patient at the following level of care:   SKILLED NURSING   (*CSW will update this form in Epic as items are completed)   03/26/2013  Patient/family provided with [facility] Department of Clinical Social Works list of facilities offering this level of care within the geographic area requested by the patient (or if unable, by the patients family).  03/26/2013  Patient/family informed of their freedom to choose among providers that offer the needed level of care, that participate in Medicare, Medicaid or managed care program needed by the patient, have an available bed and are willing to accept the patient.  03/26/2013  Patient/family informed of [facility] ownership interest in [facility], as well as of the fact that they are under no obligation to receive care at this facility.  PASARR submitted to EDS on 03/26/2013 PASARR number received from EDS on 03/26/2013  FL2 transmitted to all facilities in geographic area requested by pt/family on  03/26/2013 FL2 transmitted to all facilities within larger geographic area on   Patient informed that his/her managed care company has contracts with or will negotiate with  certain facilities, including the following:     Patient/family informed of bed offers received:  03/26/2013 [facility]ician [facility] and [facility]  [facility]  03/28/2013 Patient to be transferred to facility by [facility]  The following physician request were entered in Epic:   Additional  Comments:  Darlyn ChamberEmily [place], Theresia MajorsLCSWA Clinical Social Worker [phone]

## 2013-03-27 NOTE — Discharge Summary (Addendum)
Physician Discharge Summary  MATSUE STROM HKV:[id number] DOB: 1931/02/15 DOA: 03/24/2013  PCP: Thressa Sheller, MD  Admit date: 03/24/2013 Discharge date: 03/28/2013  Time spent: 35 minutes  Recommendations for Outpatient Follow-up:  1. Cbc, bmp 1 week 2. Monitor blood sugars 3. cipro until 1/26  Discharge Diagnoses:  Principal Problem:   Intertrochanteric fracture of right femur Active Problems:   Essential hypertension, benign   Diabetes mellitus   Closed right hip fracture   Hip fracture   Discharge Condition: improved  Diet recommendation: cardiac/diabetic  Filed Weights   03/25/13 0500  Weight: 52.164 kg (115 lb)    History of present illness:  Janet Frazier is a 80 y.o. female with history of hypertension and diabetes mellitus and previous history of multiple compression fractures of the vertebrae was brought to the ER patient had a mechanical fall at her house. Patient denies having lost consciousness or hit her head. X-rays reveal right hip and right distal radius fracture. On-call orthopedic surgeon Dr. Louanne Skye has been consulted. Patient otherwise denies any chest pain shortness of breath nausea vomiting abdominal pain diarrhea. Patient states her blood sugar has been recently running high   Hospital Course:  Assessment/Plan:  Right hip and right distal radius fracture status post mechanical fall -  -surgery done by Dr. Erlinda Hong 1/21.  Pain medications are controlling pain  - outpatient osteoporosis work up 1.) Obtain bone density measurement to confirm presumptive diagnosis, assess severity of osteoporosis and risk of future fracture, and use as baseline for monitoring treatment  2.) Obtain laboratory tests: CBC, ESR, serum calcium, creatinine, albumin,phosphate, alkaline phosphatase, liver transaminases, protein electrophoresis, urinalysis, 25-hydroxyvitamin D.  3.) Exclude secondary causes of low bone mass and skeletal fragility (eg,multiple myeloma, lymphoma) as  indicated.  4.) Obtain radiograph of thoracic and lumbar spine, particularly among individuals with back pain or height loss to assess presence of vertebral fractures  5.) Intermittent administration of recombinant human parathyroid hormone  6.) Optimize nutritional status using nutritional supplementation.  7.) Patient/family education to prevent future falls.  8.) Early mobilization and exercise program - exercise decreases the rate of bone loss and has been associated with decreased rate of fragility fractures     Diabetes mellitus type 2 -  -lantus -SSI   Hypertension -  -amlodipine and atenolol.  -resume Lasix once eating well  Anemia - closely follow CBC.  UTI -cipro BID x 2 more days- stop 1/26   Procedures:  S/p repair of fractures  Consultations:  ortho  Discharge Exam: Filed Vitals:   03/28/13 0547  BP: 129/46  Pulse: 70  Temp: 98.4 F (36.9 C)  Resp: 18    General: cooperative Cardiovascular: rrr Respiratory: clear  Discharge Instructions      Discharge Orders   Future Orders Complete By Expires   Diet - low sodium heart healthy  As directed    Diet Carb Modified  As directed    Discharge instructions  As directed    Comments:     Cbc, bmp 1 week Resume lasix and check BMP 1 week cipro until 1/26   Increase activity slowly  As directed    Weight bearing as tolerated  As directed    Questions:     Laterality:     Extremity:         Medication List    STOP taking these medications       denosumab 60 MG/ML Soln injection  Commonly known as:  PROLIA     ibuprofen 200  MG tablet  Commonly known as:  ADVIL,MOTRIN     neomycin-polymyxin-pramoxine 1 % cream  Commonly known as:  NEOSPORIN PLUS      TAKE these medications       amLODipine 5 MG tablet  Commonly known as:  NORVASC  Take 5 mg by mouth daily.     aspirin EC 325 MG tablet  Take 1 tablet (325 mg total) by mouth 2 (two) times daily.     atenolol 25 MG tablet  Commonly  known as:  TENORMIN  Take 25 mg by mouth daily.     ciprofloxacin 500 MG tablet  Commonly known as:  CIPRO  Take 1 tablet (500 mg total) by mouth 2 (two) times daily.     feeding supplement (GLUCERNA SHAKE) Liqd  Take 237 mLs by mouth daily.     ferrous fumarate 325 (106 FE) MG Tabs tablet  Commonly known as:  HEMOCYTE - 106 mg FE  Take 1 tablet by mouth every other day.     furosemide 20 MG tablet  Commonly known as:  LASIX  Take 20 mg by mouth every other day.     HYDROcodone-acetaminophen 5-325 MG per tablet  Commonly known as:  NORCO  Take 1-2 tablets by mouth every 6 (six) hours as needed.     insulin aspart 100 UNIT/ML injection  Commonly known as:  novoLOG  Inject 0-5 Units into the skin at bedtime.     insulin aspart 100 UNIT/ML injection  Commonly known as:  novoLOG  Inject 0-9 Units into the skin 3 (three) times daily with meals.     insulin glargine 100 UNIT/ML injection  Commonly known as:  LANTUS  Inject 0.22 mLs (22 Units total) into the skin daily before breakfast.     lansoprazole 30 MG capsule  Commonly known as:  PREVACID  Take 30 mg by mouth daily.     ondansetron 4 MG tablet  Commonly known as:  ZOFRAN  Take 1 tablet (4 mg total) by mouth every 6 (six) hours as needed for nausea.     oxyCODONE 5 MG immediate release tablet  Commonly known as:  Oxy IR/ROXICODONE  Take 1-3 tablets (5-15 mg total) by mouth every 4 (four) hours as needed.     polyethylene glycol packet  Commonly known as:  MIRALAX / GLYCOLAX  Take 17 g by mouth daily as needed for mild constipation.     prochlorperazine 5 MG tablet  Commonly known as:  COMPAZINE  Take 5 mg by mouth 2 (two) times daily as needed for nausea.     traMADol 50 MG tablet  Commonly known as:  ULTRAM  Take 50 mg by mouth daily as needed (pain).     Vitamin C 500 MG Chew  Chew 1 tablet (500 mg total) by mouth 2 (two) times daily.     Vitamin D3 2000 UNITS Tabs  Take 2,000 Units by mouth daily.        No Known Allergies Follow-up Information   Follow up with Marianna Payment, MD In 2 weeks.   Specialty:  Orthopedic Surgery   Contact information:   [place] [address] [phone]        The results of significant diagnostics from this hospitalization (including imaging, microbiology, ancillary and laboratory) are listed below for reference.    Significant Diagnostic Studies: Dg Chest 1 View  03/24/2013   CLINICAL DATA:  Fall.  Wrist fracture. Pre-op respiratory exam.  EXAM: CHEST -  1 VIEW  COMPARISON:  11/12/2012  FINDINGS: Low lung volumes again noted. Mild cardiomegaly is stable. No evidence of acute infiltrate or edema. Mild elevation of left hemidiaphragm and left basilar scarring is unchanged. Old left posterior rib fractures again noted.  IMPRESSION: No acute findings.   Electronically Signed   By: Earle Gell M.D.   On: 03/24/2013 20:20   Dg Shoulder Right  03/24/2013   CLINICAL DATA:  Fall.  Right shoulder injury and pain.  EXAM: RIGHT SHOULDER - 2+ VIEW  COMPARISON:  None.  FINDINGS: There is no evidence of fracture or dislocation. Diffuse osteopenia noted. No focal bone lesions identified. Mild soft tissue calcification noted.  IMPRESSION: No acute findings.  Osteopenia.   Electronically Signed   By: Earle Gell M.D.   On: 03/24/2013 20:21   Dg Wrist 2 Views Right  03/25/2013   CLINICAL DATA:  ORIF right wrist  EXAM: RIGHT WRIST - 2 VIEW  COMPARISON:  Earlier today  FINDINGS: Portable C-arm radiographs obtained in the operating room demonstrate postoperative changes compatible with open reduction and internal fixation of distal radius fracture. The hardware components and fracture fragments are in anatomic alignment. Minimally displaced ulnar styloid fracture is noted.  IMPRESSION: 1. Status post ORIF of distal radius fracture.   Electronically Signed   By: Kerby Moors M.D.   On: 03/25/2013 19:26   Dg Wrist Complete Right  03/24/2013    CLINICAL DATA:  Fall.  Right wrist pain and fracture deformity pre  EXAM: RIGHT WRIST - COMPLETE 3+ VIEW  COMPARISON:  None.  FINDINGS: Diffuse osteopenia noted. Highly comminuted and impacted fracture of the distal radius seen, with involvement of both the distal radial ulnar and radiocarpal joints. Dorsal displacement and angulation of the distal articular surface of the radius is seen. Associated fracture of the ulnar styloid process also noted. Carpal bones remain normal in alignment.  IMPRESSION: Highly comminuted and impacted fracture of the distal radius, with dorsal displacement angulation.  Ulnar styloid process fracture.  Osteopenia.   Electronically Signed   By: Earle Gell M.D.   On: 03/24/2013 20:19   Dg Hip Complete Right  03/24/2013   CLINICAL DATA:  Fall.  Right hip injury and pain.  EXAM: RIGHT HIP - COMPLETE 2+ VIEW  COMPARISON:  None.  FINDINGS: Intertrochanteric right hip fracture is seen with varus angulation. No evidence of hip dislocation.  Diffuse osteopenia noted. Old left pubic rami fractures demonstrated as well as peripheral vascular calcification.  IMPRESSION: Intertrochanteric right hip fracture.  Osteopenia.   Electronically Signed   By: Earle Gell M.D.   On: 03/24/2013 20:22   Dg Hip Operative Right  03/25/2013   CLINICAL DATA:  Fracture  EXAM: DG OPERATIVE RIGHT HIP  TECHNIQUE: A single spot fluoroscopic AP image of the right hip is submitted.  COMPARISON:  DG FEMUR*R*PORT dated 03/25/2013; DG C-ARM 1-60 MIN dated 03/25/2013  FINDINGS: Right intra medullary rod and dynamic compression screws have been placed across an intertrochanteric right femur fracture. There is anatomic alignment. No breakage or loosening of the hardware.  IMPRESSION: ORIF right intertrochanteric femur fracture.   Electronically Signed   By: Maryclare Bean M.D.   On: 03/25/2013 19:43   Ct Knee Right Wo Contrast  03/25/2013   CLINICAL DATA:  Lateral tibial plateau fracture of unknown age.  EXAM: CT OF THE  RIGHT KNEE WITHOUT CONTRAST  TECHNIQUE: Multidetector CT imaging was performed according to the standard protocol. Multiplanar CT image reconstructions were also  generated.  COMPARISON:  Radiographs dated 03/25/2013  FINDINGS: There is an old depressed fracture of the lateral tibial plateau. There is no evidence of an acute fracture. There is a small joint effusion. The tibial plateau is depressed approximately 4.6 mm. There is medial joint space narrowing. Distal femur is intact. Proximal fibula is intact. There are slight degenerative changes of the patella. There is chronic thickening of the patellar tendon.  IMPRESSION: Old slightly depressed fracture of the lateral tibial plateau. Small joint effusion. Chronic degenerative changes of the patellar tendon.   Electronically Signed   By: Rozetta Nunnery M.D.   On: 03/25/2013 21:21   Dg Femur Right Port  03/25/2013   CLINICAL DATA:  Proximal right femur fracture.  EXAM: PORTABLE RIGHT FEMUR - 2 VIEW  COMPARISON:  Radiographs dated 03/24/2013  FINDINGS: Again noted is the comminuted intertrochanteric fracture of the proximal right femur, angulated. There is diffuse osteopenia.  There is a depressed fracture of the right lateral tibial plateau, age indeterminate. Does the patient currently have right knee pain?  IMPRESSION: 1. No change in the comminuted intertrochanteric fracture of the proximal right femur. 2. Depressed fracture lateral tibial plateau, age indeterminate.   Electronically Signed   By: Rozetta Nunnery M.D.   On: 03/25/2013 14:22   Dg C-arm 1-60 Min  03/25/2013   : See accession [id number]   Electronically Signed   By: Maryclare Bean M.D.   On: 03/25/2013 19:45    Microbiology: Recent Results (from the past 240 hour(s))  URINE CULTURE     Status: None   Collection Time    03/25/13  3:33 AM      Result Value Range Status   Specimen Description URINE, CATHETERIZED   Final   Special Requests NONE   Final   Culture  Setup Time     Final   Value:  03/25/2013 04:06     Performed at [facility]     Final   Value: >=100,000 COLONIES/ML     Performed at [facility]   Culture     Final   Value: ENTEROCOCCUS SPECIES     VIRIDANS STREPTOCOCCUS     Performed at [facility]   Report Status PENDING   Incomplete     Labs: Basic Metabolic Panel:  Recent Labs Lab 03/24/13 1808 03/25/13 0446 03/26/13 0558 03/27/13 0540  NA 138 141 139 139  K 4.0 4.6 4.5 3.7  CL 100 102 100 98  CO2 $Re'27 27 28 30  'Qdm$ GLUCOSE 254* 328* 244* 80  BUN $Re'20 20 15 11  'mHM$ CREATININE 0.56 0.56 0.45* 0.49*  CALCIUM 8.2* 8.1* 8.3* 8.1*   Liver Function Tests:  Recent Labs Lab 03/25/13 0446  AST 15  ALT 12  ALKPHOS 83  BILITOT 0.3  PROT 6.0  ALBUMIN 3.0*   No results found for this basename: LIPASE, AMYLASE,  in the last 168 hours No results found for this basename: AMMONIA,  in the last 168 hours CBC:  Recent Labs Lab 03/24/13 1808 03/25/13 0446 03/26/13 0558 03/27/13 0540 03/28/13 0425  WBC 15.1* 9.6 9.9 8.8 8.9  NEUTROABS 13.4*  --   --   --   --   HGB 11.6* 10.8* 10.5* 8.9* 9.2*  HCT 34.4* 32.6* 31.1* 27.2* 27.5*  MCV 86.2 86.7 86.6 87.2 86.2  PLT 264 238 193 195 200   Cardiac Enzymes: No results found for this basename: CKTOTAL, CKMB, CKMBINDEX, TROPONINI,  in the  last 168 hours BNP: BNP (last 3 results) No results found for this basename: PROBNP,  in the last 8760 hours CBG:  Recent Labs Lab 03/27/13 0644 03/27/13 1241 03/27/13 1629 03/27/13 2235 03/28/13 0619  GLUCAP 89 145* 140* 135* 86       Signed:  Sarp Vernier  [facility] 03/28/2013, 8:24 AM

## 2013-03-28 DIAGNOSIS — S52599A Other fractures of lower end of unspecified radius, initial encounter for closed fracture: Secondary | ICD-10-CM

## 2013-03-28 LAB — URINE CULTURE: Colony Count: >=100000

## 2013-03-28 LAB — CBC
HCT: 27.5 % — ABNORMAL LOW (ref 36.0–46.0)
HEMOGLOBIN: 9.2 g/dL — AB (ref 12.0–15.0)
MCH: 28.8 pg (ref 26.0–34.0)
MCHC: 33.5 g/dL (ref 30.0–36.0)
MCV: 86.2 fL (ref 78.0–100.0)
Platelets: 200 10*3/uL (ref 150–400)
RBC: 3.19 MIL/uL — AB (ref 3.87–5.11)
RDW: 13.6 % (ref 11.5–15.5)
WBC: 8.9 10*3/uL (ref 4.0–10.5)

## 2013-03-28 LAB — GLUCOSE, CAPILLARY
Glucose-Capillary: 86 mg/dL (ref 70–99)
Glucose-Capillary: 133 mg/dL — ABNORMAL HIGH (ref 70–99)

## 2013-03-28 MED ORDER — CIPROFLOXACIN HCL 500 MG PO TABS
500.0000 mg | ORAL_TABLET | Freq: Two times a day (BID) | ORAL | Status: DC
Start: 2013-03-28 — End: 2013-06-23

## 2013-03-28 NOTE — Progress Notes (Signed)
[facility] report called and spoke with Antionnette the receiving nurse. Pt condition at discharge was stable. Daughter already aware of the discharge.

## 2013-03-28 NOTE — Progress Notes (Signed)
[facility] PROGRESS NOTE  Janet Frazier DOB: 04/30/1930 DOA: 03/24/2013 PCP: Thayer HeadingsMACKENZIE,BRIAN, MD  Assessment/Plan: Right hip and right distal radius fracture status post mechanical fall -  -surgery done by Dr. Roda ShuttersXu 1/21.  To SNF today  Diabetes mellitus type 2 - -lantus +novolog with meals -SSI- once patient back from surgery and eating  Hypertension -  -amlodipine and atenolol.  -hold Lasix   Anemia - closely follow CBC.   Code Status: full Family Communication: patient at bedside Disposition Plan: SNF today   Consultants:  ortho  Procedures:    Antibiotics:    HPI/Subjective: Feeling well   Objective: Filed Vitals:   03/28/13 0547  BP: 129/46  Pulse: 70  Temp: 98.4 F (36.9 C)  Resp: 18    Intake/Output Summary (Last 24 hours) at 03/28/13 0825 Last data filed at 03/27/13 1700  Gross per 24 hour  Intake    960 ml  Output      0 ml  Net    960 ml   Filed Weights   03/25/13 0500  Weight: 52.164 kg (115 lb)    Exam:  General: Well-developed and nourished.  Cardiovascular: S1-S2 heard.  Respiratory: No rhonchi or crepitations, clear Abdomen: Soft nontender bowel sounds present.  Musculoskeletal: arm/leg wrapped   Data Reviewed: Basic Metabolic Panel:  Recent Labs Lab 03/24/13 1808 03/25/13 0446 03/26/13 0558 03/27/13 0540  NA 138 141 139 139  K 4.0 4.6 4.5 3.7  CL 100 102 100 98  CO2 27 27 28 30   GLUCOSE 254* 328* 244* 80  BUN 20 20 15 11   CREATININE 0.56 0.56 0.45* 0.49*  CALCIUM 8.2* 8.1* 8.3* 8.1*   Liver Function Tests:  Recent Labs Lab 03/25/13 0446  AST 15  ALT 12  ALKPHOS 83  BILITOT 0.3  PROT 6.0  ALBUMIN 3.0*   No results found for this basename: LIPASE, AMYLASE,  in the last 168 hours No results found for this basename: AMMONIA,  in the last 168 hours CBC:  Recent Labs Lab 03/24/13 1808 03/25/13 0446 03/26/13 0558 03/27/13 0540 03/28/13 0425  WBC 15.1* 9.6 9.9 8.8 8.9   NEUTROABS 13.4*  --   --   --   --   HGB 11.6* 10.8* 10.5* 8.9* 9.2*  HCT 34.4* 32.6* 31.1* 27.2* 27.5*  MCV 86.2 86.7 86.6 87.2 86.2  PLT 264 238 193 195 200   Cardiac Enzymes: No results found for this basename: CKTOTAL, CKMB, CKMBINDEX, TROPONINI,  in the last 168 hours BNP (last 3 results) No results found for this basename: PROBNP,  in the last 8760 hours CBG:  Recent Labs Lab 03/27/13 0644 03/27/13 1241 03/27/13 1629 03/27/13 2235 03/28/13 0619  GLUCAP 89 145* 140* 135* 86    Recent Results (from the past 240 hour(s))  URINE CULTURE     Status: None   Collection Time    03/25/13  3:33 AM      Result Value Range Status   Specimen Description URINE, CATHETERIZED   Final   Special Requests NONE   Final   Culture  Setup Time     Final   Value: 03/25/2013 04:06     Performed at [facility]   Colony Count     Final   Value: >=100,000 COLONIES/ML     [facility]formed at [facility]   Culture     Final   Value: ENTEROCOCCUS SPECIES     VIRIDANS STREPTOCOCCUS     Performed at [facility]   Report Status PENDING   Incomplete     Studies: No results found.  Scheduled Meds: . amLODipine  5 mg Oral Daily  . aspirin EC  325 mg Oral BID  . atenolol  25 mg Oral Daily  . cholecalciferol  2,000 Units Oral Daily  . ciprofloxacin  500 mg Oral BID  . feeding supplement (GLUCERNA SHAKE)  237 mL Oral Q24H  . ferrous fumarate  1 tablet Oral QODAY  . insulin aspart  0-5 Units Subcutaneous QHS  . insulin aspart  0-9 Units Subcutaneous TID WC  . insulin glargine  22 Units Subcutaneous QAC breakfast  . pantoprazole  40 mg Oral Daily  . senna  1 tablet Oral BID   Continuous Infusions:    Principal Problem:   Intertrochanteric fracture of right femur Active Problems:   Essential hypertension, benign   Diabetes mellitus   Closed right hip fracture   Hip fracture    Time spent: 25 min    Janet Frazier, Janet Frazier  [facility] Pager [phone] If  7PM-7AM, please contact night-coverage at [email or web address], password [facility] 03/28/2013, 8:25 AM  LOS: 4 days

## 2013-03-28 NOTE — Progress Notes (Signed)
Subjective: 3 Days Post-Op Procedure(s) (LRB): INTRAMEDULLARY (IM) NAIL RIGHT INTERTROCHANTRIC HIP FRACTURE (Right) OPEN REDUCTION INTERNAL FIXATION (ORIF) RIGHT DISTAL RADIUS FRACTURE (Right) Patient reports pain as mild.    Objective: Vital signs in last 24 hours: Temp:  [97.3 F (36.3 C)-99.6 F (37.6 C)] 98.2 F (36.8 C) (01/24 1017) Pulse Rate:  [63-75] 74 (01/24 1017) Resp:  [18-22] 18 (01/24 1017) BP: (111-144)/(34-52) 111/34 mmHg (01/24 1017) SpO2:  [91 %-95 %] 95 % (01/24 1017)  Intake/Output from previous day: 01/23 0701 - 01/24 0700 In: 960 [P.O.:960] Out: -  Intake/Output this shift: Total I/O In: 120 [P.O.:120] Out: -    Recent Labs  03/26/13 0558 03/27/13 0540 03/28/13 0425  HGB 10.5* 8.9* 9.2*    Recent Labs  03/27/13 0540 03/28/13 0425  WBC 8.8 8.9  RBC 3.12* 3.19*  HCT 27.2* 27.5*  PLT 195 200    Recent Labs  03/26/13 0558 03/27/13 0540  NA 139 139  K 4.5 3.7  CL 100 98  CO2 28 30  BUN 15 11  CREATININE 0.45* 0.49*  GLUCOSE 244* 80  CALCIUM 8.3* 8.1*   No results found for this basename: LABPT, INR,  in the last 72 hours  Neurologically intact Sensation intact distally Intact pulses distally Dorsiflexion/Plantar flexion intact Incision: dressing C/D/I  Assessment/Plan: 3 Days Post-Op Procedure(s) (LRB): INTRAMEDULLARY (IM) NAIL RIGHT INTERTROCHANTRIC HIP FRACTURE (Right) OPEN REDUCTION INTERNAL FIXATION (ORIF) RIGHT DISTAL RADIUS FRACTURE (Right) Discharge to SNF from Ortho Standpoint  [facility]ETRARCA,BRIAN 03/28/2013, 11:16 AM

## 2013-03-28 NOTE — Progress Notes (Signed)
Patient is medically stable for [facility]. Clinical Child psychotherapistocial Worker (CSW) prepared D/C packet and arranged non-emergency EMS ([facility]) for transport. CSW confirmed with [facility] that everything is ready for patient to come today. CSW contacted patient's daughter Franklyn LorCarolyn Rivers [phone] and made her aware of above. Please reconsult if further social work needs arise. CSW signing off.   Jetta LoutBailey Morgan, LCSWA Weekend CSW [phone]

## 2013-05-21 ENCOUNTER — Encounter (INDEPENDENT_AMBULATORY_CARE_PROVIDER_SITE_OTHER): Payer: Self-pay

## 2013-05-21 ENCOUNTER — Ambulatory Visit (INDEPENDENT_AMBULATORY_CARE_PROVIDER_SITE_OTHER): Payer: Medicare Other | Admitting: Neurology

## 2013-05-21 ENCOUNTER — Encounter: Payer: Self-pay | Admitting: Neurology

## 2013-05-21 VITALS — BP 147/76 | HR 83 | Ht <= 58 in | Wt 95.0 lb

## 2013-05-21 DIAGNOSIS — Z87440 Personal history of urinary (tract) infections: Secondary | ICD-10-CM | POA: Insufficient documentation

## 2013-05-21 DIAGNOSIS — I10 Essential (primary) hypertension: Secondary | ICD-10-CM

## 2013-05-21 DIAGNOSIS — G5600 Carpal tunnel syndrome, unspecified upper limb: Secondary | ICD-10-CM

## 2013-05-21 DIAGNOSIS — E119 Type 2 diabetes mellitus without complications: Secondary | ICD-10-CM

## 2013-05-21 DIAGNOSIS — I509 Heart failure, unspecified: Secondary | ICD-10-CM

## 2013-05-21 DIAGNOSIS — R413 Other amnesia: Secondary | ICD-10-CM | POA: Insufficient documentation

## 2013-05-21 DIAGNOSIS — S72009A Fracture of unspecified part of neck of unspecified femur, initial encounter for closed fracture: Secondary | ICD-10-CM

## 2013-05-21 DIAGNOSIS — S72001A Fracture of unspecified part of neck of right femur, initial encounter for closed fracture: Secondary | ICD-10-CM

## 2013-05-21 DIAGNOSIS — G629 Polyneuropathy, unspecified: Secondary | ICD-10-CM

## 2013-05-21 NOTE — Progress Notes (Signed)
PATIENT: Janet Frazier DOB: 1931/01/14  HISTORICAL  Janet Frazier is 80 years old right-handed Caucasian female, referred by her primary care physician Dr. Thea Silversmith, accompanied by her daughters, Janet Frazier at today's clinical visit  She had long-standing history of insulin-dependent diabetes, diabetic retinopathy, decreased vision, hypertension, she had 13 years of education, is a homemaker, retired Diplomatic Services operational officer  She lived in [place], daughter moved her to [place] around 2013, she lived in a condominium by herself initially, was able to drive around the town, but was noticed has mild memory trouble, her daughter attributed that to old age, she has chronic low back pain, using walkers occasionally,  In the summer of 2014, she had sudden decline in her functional status, she become more forgetful, using home words when she talks, also develop unsteady gait, she tends to fall, she fell was admitted to hospital, later discharged to rehabilitation, was noticed to have increased confusion,  Ultrasound of carotid artery in July 2014 has demonstrated less than 50 % stenosis at bilateral carotid arteries A1c was 9 point 1 in September 2014  She used to be a vivid reader, she could no longer read books anymore, around September 2014, she could no longer manage her medications, such as checking her glucose, and draw her insulin, which she has done for many years, family began to get sitter during the day, she fell again in January 2015, broke her right hip, and right wrist, require surgery,  She is currently at rehabilitation center, was getting multiple pain medications, including tramadol, oxycodone, and other medications, family noticed that she has increased confusion, also has hallucinations, that her brother is in the facilities, she has increased gait difficulty, But making progress with rehabilitations, she was recently treated with Cipro for her UTI,  Has increased confusion,  more at evening time   REVIEW OF SYSTEMS: Full 14 system review of systems performed and notable only for confusion,  ALLERGIES: No Known Allergies  HOME MEDICATIONS: Current Outpatient Prescriptions on File Prior to Visit  Medication Sig Dispense Refill  . amLODipine (NORVASC) 5 MG tablet Take 5 mg by mouth daily.      . Ascorbic Acid (VITAMIN C) 500 MG CHEW Chew 1 tablet (500 mg total) by mouth 2 (two) times daily.  84 tablet  0  . aspirin EC 325 MG tablet Take 1 tablet (325 mg total) by mouth 2 (two) times daily.  84 tablet  0  . atenolol (TENORMIN) 25 MG tablet Take 25 mg by mouth daily.      . Cholecalciferol (VITAMIN D3) 2000 UNITS TABS Take 2,000 Units by mouth daily.       . ciprofloxacin (CIPRO) 500 MG tablet Take 1 tablet (500 mg total) by mouth 2 (two) times daily.      . feeding supplement, GLUCERNA SHAKE, (GLUCERNA SHAKE) LIQD Take 237 mLs by mouth daily.    0  . ferrous fumarate (HEMOCYTE - 106 MG FE) 325 (106 FE) MG TABS Take 1 tablet by mouth every other day.      . furosemide (LASIX) 20 MG tablet Take 20 mg by mouth every other day.      . insulin aspart (NOVOLOG) 100 UNIT/ML injection Inject 0-5 Units into the skin at bedtime.  10 mL  11  . insulin aspart (NOVOLOG) 100 UNIT/ML injection Inject 0-9 Units into the skin 3 (three) times daily with meals.  10 mL  11  . insulin glargine (LANTUS) 100 UNIT/ML injection Inject 0.22  mLs (22 Units total) into the skin daily before breakfast.  10 mL  11  . lansoprazole (PREVACID) 30 MG capsule Take 30 mg by mouth daily.      . ondansetron (ZOFRAN) 4 MG tablet Take 1 tablet (4 mg total) by mouth every 6 (six) hours as needed for nausea.  20 tablet  0  . polyethylene glycol (MIRALAX / GLYCOLAX) packet Take 17 g by mouth daily as needed for mild constipation.  14 each  0  . prochlorperazine (COMPAZINE) 5 MG tablet Take 5 mg by mouth 2 (two) times daily as needed for nausea.       . traMADol (ULTRAM) 50 MG tablet Take 50 mg by mouth  daily as needed (pain).         PAST MEDICAL HISTORY: Past Medical History  Diagnosis Date  . Osteoporosis   . Diabetes mellitus without complication   . GERD (gastroesophageal reflux disease)   . Back pain   . Hypertension   . Arthritis   . Hepatitis     as a child  . Crohn's disease   . CHF (congestive heart failure)   . Neuropathy   . History of recurrent UTIs   . Carpal tunnel syndrome     PAST SURGICAL HISTORY: Past Surgical History  Procedure Laterality Date  . Knee surgery Right   . Elbow surgery Left   . Abdominal surgery    . Eye surgery      bilateral cataracts  . Colonoscopy    . Hardware removal Left 02/09/2013    Procedure: HARDWARE REMOVAL, IRRIGATION AND DEBRIDEMENT LEFT ELBOW;  Surgeon: Cheral Almas, MD;  Location: [facility] OR;  Service: Orthopedics;  Laterality: Left;  . Intramedullary (im) nail intertrochanteric Right 03/25/2013    Procedure: INTRAMEDULLARY (IM) NAIL RIGHT INTERTROCHANTRIC HIP FRACTURE;  Surgeon: Cheral Almas, MD;  Location: [facility] OR;  Service: Orthopedics;  Laterality: Right;  . Open reduction internal fixation (orif) distal radial fracture Right 03/25/2013    Procedure: OPEN REDUCTION INTERNAL FIXATION (ORIF) RIGHT DISTAL RADIUS FRACTURE;  Surgeon: Cheral Almas, MD;  Location: [facility] OR;  Service: Orthopedics;  Laterality: Right;    FAMILY HISTORY: Family History  Problem Relation Age of Onset  . Stroke Father   . Heart Problems Mother     SOCIAL HISTORY:  History   Social History  . Marital Status: Single    Spouse Name: N/A    Number of Children: 4  . Years of Education: 33   Occupational History    retired   Social History Main Topics  . Smoking status: Former Games developer  . Smokeless tobacco: Never Used     Comment: Quit in 1990  . Alcohol Use: No  . Drug Use: No  . Sexual Activity: Not on file   Other Topics Concern  . Not on file   Social History Narrative   Patient lives at home and lives at [facility]. Patient is planning to go home after rehab.   Retired   Right handed   Education some college   Caffeine none     PHYSICAL EXAM   Filed Vitals:   05/21/13 1037  BP: 147/76  Pulse: 83  Height: 4\' 10"  (1.473 m)  Weight: 95 lb (43.092 kg)    Not recorded    Body mass index is 19.86 kg/(m^2).   Generalized: In no acute distress  Neck: Supple, no carotid bruits   Cardiac: Regular rate rhythm  Pulmonary: Clear  to auscultation bilaterally  Musculoskeletal: No deformity  Neurological examination  Mentation: Mini-Mental Status Examination is 18 out of 30, she is not oriented to time, date, place, missed 3 out of 3 recalls, has difficulty spell World backwards   Cranial nerve II-XII: Pupils were equal round reactive to light. Extraocular movements were full.  Visual field were full on confrontational test. Bilateral fundi were sharp.  Facial sensation and strength were normal. Hearing was intact to finger rubbing bilaterally. Uvula tongue midline.  Head turning and shoulder shrug and were normal and symmetric.Tongue protrusion into cheek strength was normal.  Motor: Right wrist in splint, she has mild right hip flexion, right ankle flexion weakness,   Sensory: not reliable, seems to have mild length dependent sensory changes,   Coordination: Normal finger to nose, heel-to-shin bilaterally there was no truncal ataxia  Gait: Rising up from seated position by pushing on a chair arm, cautious, dragging her right leg, right toe pointing outward,  Romberg signs: Negative  Deep tendon reflexes: Brachioradialis  one/one , biceps1/1 , triceps  one/one , patellar  absent , Achilles absent , plantar responses were flexor bilaterally.   DIAGNOSTIC DATA (LABS, IMAGING, TESTING) - I reviewed patient records, labs, notes, testing and imaging myself where available.  Lab Results  Component Value Date   WBC 8.9 03/28/2013   HGB 9.2* 03/28/2013   HCT 27.5* 03/28/2013   MCV  86.2 03/28/2013   PLT 200 03/28/2013      Component Value Date/Time   NA 139 03/27/2013 0540   K 3.7 03/27/2013 0540   CL 98 03/27/2013 0540   CO2 30 03/27/2013 0540   GLUCOSE 80 03/27/2013 0540   BUN 11 03/27/2013 0540   CREATININE 0.49* 03/27/2013 0540   CALCIUM 8.1* 03/27/2013 0540   PROT 6.0 03/25/2013 0446   ALBUMIN 3.0* 03/25/2013 0446   AST 15 03/25/2013 0446   ALT 12 03/25/2013 0446   ALKPHOS 83 03/25/2013 0446   BILITOT 0.3 03/25/2013 0446   GFRNONAA 90* 03/27/2013 0540   GFRAA >90 03/27/2013 0540   No results found for this basename: CHOL, HDL, LDLCALC, LDLDIRECT, TRIG, CHOLHDL   Lab Results  Component Value Date   HGBA1C 9.1* 11/12/2012   ASSESSMENT AND PLAN  Ethelda Chickatricia M Baba is a 80 y.o. female presenting with sudden worsening memory trouble, daughter. noticed word finding difficulties, but I did not see any significant comprehensive, or expressive aphasia on today's examination, Mini-Mental Status exam is 18 out of 30 today, she also has mild gait difficulty,   1. Her memory trouble but due to central nervous system degenerative disorder, vs. vascular component, she has a long-standing history of diabetes, hypertension  2. Complete evaluation with MRI of her brain, 3. Laboratory evaluations, 4. I have cleanup her medication list, she should probably only have oxycodone 5 mg half tablet 3 times a day 5 return to clinic in one month    Levert FeinsteinYijun Loyd Salvador, M.D. Ph.D.  [facility] [address], [address] [place], [place] [address] [phone]

## 2013-05-29 ENCOUNTER — Ambulatory Visit
Admission: RE | Admit: 2013-05-29 | Discharge: 2013-05-29 | Disposition: A | Discharge status: Home / Self Care | Payer: No Typology Code available for payment source | Source: Ambulatory Visit | Attending: Neurology | Admitting: Neurology

## 2013-05-29 DIAGNOSIS — S72001A Fracture of unspecified part of neck of right femur, initial encounter for closed fracture: Secondary | ICD-10-CM

## 2013-05-29 DIAGNOSIS — R413 Other amnesia: Secondary | ICD-10-CM

## 2013-05-29 DIAGNOSIS — E119 Type 2 diabetes mellitus without complications: Secondary | ICD-10-CM

## 2013-05-29 DIAGNOSIS — S72009A Fracture of unspecified part of neck of unspecified femur, initial encounter for closed fracture: Secondary | ICD-10-CM

## 2013-05-29 DIAGNOSIS — I509 Heart failure, unspecified: Secondary | ICD-10-CM

## 2013-05-29 DIAGNOSIS — G5600 Carpal tunnel syndrome, unspecified upper limb: Secondary | ICD-10-CM

## 2013-05-29 DIAGNOSIS — I10 Essential (primary) hypertension: Secondary | ICD-10-CM

## 2013-06-23 ENCOUNTER — Ambulatory Visit (INDEPENDENT_AMBULATORY_CARE_PROVIDER_SITE_OTHER): Payer: Medicare Other | Admitting: Neurology

## 2013-06-23 ENCOUNTER — Encounter (INDEPENDENT_AMBULATORY_CARE_PROVIDER_SITE_OTHER): Payer: Self-pay

## 2013-06-23 ENCOUNTER — Encounter: Payer: Self-pay | Admitting: Neurology

## 2013-06-23 VITALS — BP 144/64 | HR 62

## 2013-06-23 DIAGNOSIS — R413 Other amnesia: Secondary | ICD-10-CM

## 2013-06-23 DIAGNOSIS — M549 Dorsalgia, unspecified: Secondary | ICD-10-CM

## 2013-06-23 DIAGNOSIS — S72009A Fracture of unspecified part of neck of unspecified femur, initial encounter for closed fracture: Secondary | ICD-10-CM

## 2013-06-23 MED ORDER — QUETIAPINE FUMARATE 25 MG PO TABS: ORAL_TABLET | ORAL | Status: AC

## 2013-06-23 MED ORDER — OXYCODONE HCL 5 MG PO TABA: ORAL_TABLET | ORAL | Status: DC

## 2013-06-23 NOTE — Progress Notes (Signed)
PATIENT: Janet Frazier DOB: 05/15/1930  HISTORICAL  Janet Frazier is 80 years old right-handed Caucasian female, referred by her primary care physician Dr. Thea Frazier, accompanied by her daughters, Janet Frazier, Janet Frazier at today's clinical visit, initial visit was March 19th 2015.  She had long-standing history of insulin-dependent diabetes, diabetic retinopathy, decreased vision, hypertension, she had 13 years of education, is a homemaker, retired Diplomatic Services operational officersecretary  She lived in [place], [place] around 2013, she lived in a condominium by herself initially, was able to drive around the town, but was noticed has mild memory trouble, her daughter attributed that to old age, she has chronic low back pain, using walkers occasionally,  In the summer of 2014, she had sudden decline in her functional status, she become more forgetful word finding difficulties when she talks, also develop unsteady gait, she tends to fall, she fell was admitted to hospital, later discharged to rehabilitation, was noticed to have increased confusion,  Ultrasound of carotid artery in July 2014 has demonstrated less than 50 % stenosis at bilateral carotid arteries  A1c was 9.1 in September 2014  She used to be a vivid reader, she could no longer read books anymore, around September 2014, she could no longer manage her medications, such as checking her glucose, and draw her insulin, which she has done for many years, family began to get sitter during the day, she fell again in January 2015, broke her right hip, and right wrist, require surgery,  She was at rehabilitation center, [facility] with daughter or sitter in with her, was getting multiple pain medications, including tramadol, oxycodone, and other medications, family noticed that she has increased confusion, also has hallucinations, that her brother is in the facilities, she has increased gait difficulty,  But making  progress with rehabilitations, she was recently treated with Cipro for her UTI,  Has increased confusion, more at evening time  UPDATE June 23 2013:  We decreased her medications on last visit in March 19th 2015, she is only taking oxycodone 5mg  tid, she has no pain, is back home, she has nighttime agitation, wandering around occasionally, daughter has been giving her Advil PM every night in past couple weeks, which has been helpful,  We have reviewed MRI of the brain, mild to moderate atrophy, small vessel disease  REVIEW OF SYSTEMS: Full 14 system review of systems performed and notable only for confusion, activity change, decreased vision, frequent wakening, acting out of dreams, incontinence of bladder or, right knee pain, low back pain achy muscles, walking difficulty, memory loss, agitation confusion, decreased concentration, anxious,  ALLERGIES: No Known Allergies  HOME MEDICATIONS: Current Outpatient Prescriptions on File Prior to Visit  Medication Sig Dispense Refill  . amLODipine (NORVASC) 5 MG tablet Take 5 mg by mouth daily.      . Ascorbic Acid (VITAMIN C) 500 MG CHEW Chew 1 tablet (500 mg total) by mouth 2 (two) times daily.  84 tablet  0  . aspirin EC 325 MG tablet Take 1 tablet (325 mg total) by mouth 2 (two) times daily.  84 tablet  0  . atenolol (TENORMIN) 25 MG tablet Take 25 mg by mouth daily.      . Cholecalciferol (VITAMIN D3) 2000 UNITS TABS Take 2,000 Units by mouth daily.       . ciprofloxacin (CIPRO) 500 MG tablet Take 1 tablet (500 mg total) by mouth 2 (two) times daily.      . feeding  supplement, GLUCERNA SHAKE, (GLUCERNA SHAKE) LIQD Take 237 mLs by mouth daily.    0  . ferrous fumarate (HEMOCYTE - 106 MG FE) 325 (106 FE) MG TABS Take 1 tablet by mouth every other day.      . furosemide (LASIX) 20 MG tablet Take 20 mg by mouth every other day.      . insulin aspart (NOVOLOG) 100 UNIT/ML injection Inject 0-5 Units into the skin at bedtime.  10 mL  11  . insulin  aspart (NOVOLOG) 100 UNIT/ML injection Inject 0-9 Units into the skin 3 (three) times daily with meals.  10 mL  11  . insulin glargine (LANTUS) 100 UNIT/ML injection Inject 0.22 mLs (22 Units total) into the skin daily before breakfast.  10 mL  11  . lansoprazole (PREVACID) 30 MG capsule Take 30 mg by mouth daily.      . ondansetron (ZOFRAN) 4 MG tablet Take 1 tablet (4 mg total) by mouth every 6 (six) hours as needed for nausea.  20 tablet  0  . polyethylene glycol (MIRALAX / GLYCOLAX) packet Take 17 g by mouth daily as needed for mild constipation.  14 each  0  . prochlorperazine (COMPAZINE) 5 MG tablet Take 5 mg by mouth 2 (two) times daily as needed for nausea.       . traMADol (ULTRAM) 50 MG tablet Take 50 mg by mouth daily as needed (pain).         PAST MEDICAL HISTORY: Past Medical History  Diagnosis Date  . Osteoporosis   . Diabetes mellitus without complication   . GERD (gastroesophageal reflux disease)   . Back pain   . Hypertension   . Arthritis   . Hepatitis     as a child  . Crohn's disease   . CHF (congestive heart failure)   . Neuropathy   . History of recurrent UTIs   . Carpal tunnel syndrome     PAST SURGICAL HISTORY: Past Surgical History  Procedure Laterality Date  . Knee surgery Right   . Elbow surgery Left   . Abdominal surgery    . Eye surgery      bilateral cataracts  . Colonoscopy    . Hardware removal Left 02/09/2013    Procedure: HARDWARE REMOVAL, IRRIGATION AND DEBRIDEMENT LEFT ELBOW;  Surgeon: Cheral Almas, MD;  Location: [facility] OR;  Service: Orthopedics;  Laterality: Left;  . Intramedullary (im) nail intertrochanteric Right 03/25/2013    Procedure: INTRAMEDULLARY (IM) NAIL RIGHT INTERTROCHANTRIC HIP FRACTURE;  Surgeon: Cheral Almas, MD;  Location: [facility] OR;  Service: Orthopedics;  Laterality: Right;  . Open reduction internal fixation (orif) distal radial fracture Right 03/25/2013    Procedure: OPEN REDUCTION INTERNAL FIXATION (ORIF) RIGHT  DISTAL RADIUS FRACTURE;  Surgeon: Cheral Almas, MD;  Location: [facility] OR;  Service: Orthopedics;  Laterality: Right;    FAMILY HISTORY: Family History  Problem Relation Age of Onset  . Stroke Father   . Heart Problems Mother     SOCIAL HISTORY:  History   Social History  . Marital Status: Single    Spouse Name: N/A    Number of Children: 4  . Years of Education: 40   Occupational History    retired   Social History Main Topics  . Smoking status: Former Games developer  . Smokeless tobacco: Never Used     Comment: Quit in 1990  . Alcohol Use: No  . Drug Use: No  . Sexual Activity: Not on file   Other  Topics Concern  . Not on file   Social History Narrative   Patient lives at home and [facility]. Patient is planning to go home after rehab.   Retired   Right handed   Education some college   Caffeine none     PHYSICAL EXAM   Filed Vitals:   06/23/13 0843  BP: 144/64  Pulse: 62    Not recorded    Cannot calculate BMI with a height equal to zero.   Generalized: In no acute distress  Neck: Supple, no carotid bruits   Cardiac: Regular rate rhythm  Pulmonary: Clear to auscultation bilaterally  Musculoskeletal: No deformity  Neurological examination  Mentation: Mini-Mental Status Examination is 18 out of 30, she is not oriented to time, date, place, missed 3 out of 3 recalls, has difficulty spell World backwards   Cranial nerve II-XII: Pupils were equal round reactive to light. Extraocular movements were full.  Visual field were full on confrontational test. Bilateral fundi were sharp.  Facial sensation and strength were normal. Hearing was intact to finger rubbing bilaterally. Uvula tongue midline.  Head turning and shoulder shrug and were normal and symmetric.Tongue protrusion into cheek strength was normal.  Motor: Right wrist in splint, she has mild right hip flexion, right ankle flexion weakness,   Sensory: not reliable, seems to  have mild length dependent sensory changes,   Coordination: Normal finger to nose, heel-to-shin bilaterally there was no truncal ataxia  Gait: Rising up from seated position by pushing on chair arm, cautious, dragging her right leg, right toe pointing outward, unsteady gait  Romberg signs: Negative  Deep tendon reflexes: Brachioradialis  one/one , biceps1/1 , triceps  one/one , patellar  absent , Achilles absent , plantar responses were flexor bilaterally.   DIAGNOSTIC DATA (LABS, IMAGING, TESTING) - I reviewed patient records, labs, notes, testing and imaging myself where available.  Lab Results  Component Value Date   WBC 8.9 03/28/2013   HGB 9.2* 03/28/2013   HCT 27.5* 03/28/2013   MCV 86.2 03/28/2013   PLT 200 03/28/2013      Component Value Date/Time   NA 139 03/27/2013 0540   K 3.7 03/27/2013 0540   CL 98 03/27/2013 0540   CO2 30 03/27/2013 0540   GLUCOSE 80 03/27/2013 0540   BUN 11 03/27/2013 0540   CREATININE 0.49* 03/27/2013 0540   CALCIUM 8.1* 03/27/2013 0540   PROT 6.0 03/25/2013 0446   ALBUMIN 3.0* 03/25/2013 0446   AST 15 03/25/2013 0446   ALT 12 03/25/2013 0446   ALKPHOS 83 03/25/2013 0446   BILITOT 0.3 03/25/2013 0446   GFRNONAA 90* 03/27/2013 0540   GFRAA >90 03/27/2013 0540   No results found for this basename: CHOL,  HDL,  LDLCALC,  LDLDIRECT,  TRIG,  CHOLHDL   Lab Results  Component Value Date   HGBA1C 9.1* 11/12/2012   ASSESSMENT AND PLAN  Janet Frazier is a 80 y.o. female presenting with sudden worsening memory trouble, daughter. noticed word finding difficulties, but I did not see any significant comprehensive, or expressive aphasia on today's examination, Mini-Mental Status exam is 18 out of 30 today, she also has mild gait difficulty,   1. Her memory trouble but due to central nervous system degenerative disorder, likely a vascular component, she has a long-standing history of diabetes, hypertension 2.  Will add seroquel 12.5-25 mg every night 3. She needs  lab evaluation such as TSH, B12, she refuse lab today. 4. May consider  Namenda, donepezil on next visit with Eber Jones in 6 months   Levert Feinstein, M.D. Ph.D.  [facility] [address] [place], [place] [address] [phone]

## 2013-08-28 ENCOUNTER — Emergency Department (HOSPITAL_COMMUNITY)
Admission: EM | Admit: 2013-08-28 | Discharge: 2013-08-28 | Disposition: A | Discharge status: Home / Self Care | Payer: Medicare Other | Source: Home / Self Care | Attending: Emergency Medicine | Admitting: Emergency Medicine

## 2013-08-28 ENCOUNTER — Encounter (HOSPITAL_COMMUNITY): Payer: Self-pay | Admitting: Emergency Medicine

## 2013-08-28 ENCOUNTER — Emergency Department (HOSPITAL_COMMUNITY): Payer: Medicare Other

## 2013-08-28 DIAGNOSIS — E119 Type 2 diabetes mellitus without complications: Secondary | ICD-10-CM | POA: Insufficient documentation

## 2013-08-28 DIAGNOSIS — I10 Essential (primary) hypertension: Secondary | ICD-10-CM | POA: Diagnosis present

## 2013-08-28 DIAGNOSIS — Z8673 Personal history of transient ischemic attack (TIA), and cerebral infarction without residual deficits: Secondary | ICD-10-CM

## 2013-08-28 DIAGNOSIS — F411 Generalized anxiety disorder: Secondary | ICD-10-CM | POA: Diagnosis present

## 2013-08-28 DIAGNOSIS — E870 Hyperosmolality and hypernatremia: Secondary | ICD-10-CM | POA: Diagnosis not present

## 2013-08-28 DIAGNOSIS — S8990XA Unspecified injury of unspecified lower leg, initial encounter: Secondary | ICD-10-CM

## 2013-08-28 DIAGNOSIS — R1110 Vomiting, unspecified: Secondary | ICD-10-CM

## 2013-08-28 DIAGNOSIS — K922 Gastrointestinal hemorrhage, unspecified: Secondary | ICD-10-CM | POA: Diagnosis present

## 2013-08-28 DIAGNOSIS — E1139 Type 2 diabetes mellitus with other diabetic ophthalmic complication: Secondary | ICD-10-CM | POA: Diagnosis present

## 2013-08-28 DIAGNOSIS — Z8739 Personal history of other diseases of the musculoskeletal system and connective tissue: Secondary | ICD-10-CM | POA: Insufficient documentation

## 2013-08-28 DIAGNOSIS — M129 Arthropathy, unspecified: Secondary | ICD-10-CM | POA: Diagnosis present

## 2013-08-28 DIAGNOSIS — N179 Acute kidney failure, unspecified: Secondary | ICD-10-CM | POA: Diagnosis not present

## 2013-08-28 DIAGNOSIS — M549 Dorsalgia, unspecified: Secondary | ICD-10-CM | POA: Insufficient documentation

## 2013-08-28 DIAGNOSIS — K219 Gastro-esophageal reflux disease without esophagitis: Secondary | ICD-10-CM | POA: Insufficient documentation

## 2013-08-28 DIAGNOSIS — Z79899 Other long term (current) drug therapy: Secondary | ICD-10-CM | POA: Insufficient documentation

## 2013-08-28 DIAGNOSIS — Z794 Long term (current) use of insulin: Secondary | ICD-10-CM

## 2013-08-28 DIAGNOSIS — Y9389 Activity, other specified: Secondary | ICD-10-CM | POA: Insufficient documentation

## 2013-08-28 DIAGNOSIS — Z8639 Personal history of other endocrine, nutritional and metabolic disease: Secondary | ICD-10-CM | POA: Insufficient documentation

## 2013-08-28 DIAGNOSIS — Z87440 Personal history of urinary (tract) infections: Secondary | ICD-10-CM

## 2013-08-28 DIAGNOSIS — Z87891 Personal history of nicotine dependence: Secondary | ICD-10-CM

## 2013-08-28 DIAGNOSIS — E876 Hypokalemia: Secondary | ICD-10-CM | POA: Diagnosis present

## 2013-08-28 DIAGNOSIS — Z66 Do not resuscitate: Secondary | ICD-10-CM | POA: Diagnosis present

## 2013-08-28 DIAGNOSIS — I4891 Unspecified atrial fibrillation: Secondary | ICD-10-CM | POA: Diagnosis present

## 2013-08-28 DIAGNOSIS — E873 Alkalosis: Secondary | ICD-10-CM | POA: Diagnosis not present

## 2013-08-28 DIAGNOSIS — Z8719 Personal history of other diseases of the digestive system: Secondary | ICD-10-CM | POA: Insufficient documentation

## 2013-08-28 DIAGNOSIS — E872 Acidosis, unspecified: Secondary | ICD-10-CM | POA: Diagnosis present

## 2013-08-28 DIAGNOSIS — F0390 Unspecified dementia without behavioral disturbance: Secondary | ICD-10-CM | POA: Diagnosis present

## 2013-08-28 DIAGNOSIS — I509 Heart failure, unspecified: Secondary | ICD-10-CM | POA: Diagnosis present

## 2013-08-28 DIAGNOSIS — Z9889 Other specified postprocedural states: Secondary | ICD-10-CM

## 2013-08-28 DIAGNOSIS — R627 Adult failure to thrive: Secondary | ICD-10-CM | POA: Diagnosis present

## 2013-08-28 DIAGNOSIS — S99929A Unspecified injury of unspecified foot, initial encounter: Secondary | ICD-10-CM

## 2013-08-28 DIAGNOSIS — R296 Repeated falls: Secondary | ICD-10-CM | POA: Insufficient documentation

## 2013-08-28 DIAGNOSIS — Y9289 Other specified places as the place of occurrence of the external cause: Secondary | ICD-10-CM

## 2013-08-28 DIAGNOSIS — E785 Hyperlipidemia, unspecified: Secondary | ICD-10-CM | POA: Diagnosis present

## 2013-08-28 DIAGNOSIS — Z862 Personal history of diseases of the blood and blood-forming organs and certain disorders involving the immune mechanism: Secondary | ICD-10-CM

## 2013-08-28 DIAGNOSIS — Z9181 History of falling: Secondary | ICD-10-CM

## 2013-08-28 DIAGNOSIS — R112 Nausea with vomiting, unspecified: Secondary | ICD-10-CM | POA: Diagnosis not present

## 2013-08-28 DIAGNOSIS — Z823 Family history of stroke: Secondary | ICD-10-CM

## 2013-08-28 DIAGNOSIS — G589 Mononeuropathy, unspecified: Secondary | ICD-10-CM | POA: Diagnosis present

## 2013-08-28 DIAGNOSIS — M810 Age-related osteoporosis without current pathological fracture: Secondary | ICD-10-CM | POA: Diagnosis present

## 2013-08-28 DIAGNOSIS — S99919A Unspecified injury of unspecified ankle, initial encounter: Secondary | ICD-10-CM

## 2013-08-28 DIAGNOSIS — R64 Cachexia: Secondary | ICD-10-CM | POA: Diagnosis present

## 2013-08-28 DIAGNOSIS — Z515 Encounter for palliative care: Secondary | ICD-10-CM

## 2013-08-28 DIAGNOSIS — Z681 Body mass index (BMI) 19 or less, adult: Secondary | ICD-10-CM

## 2013-08-28 DIAGNOSIS — Z8669 Personal history of other diseases of the nervous system and sense organs: Secondary | ICD-10-CM | POA: Insufficient documentation

## 2013-08-28 DIAGNOSIS — M25562 Pain in left knee: Secondary | ICD-10-CM

## 2013-08-28 DIAGNOSIS — G8929 Other chronic pain: Secondary | ICD-10-CM | POA: Diagnosis present

## 2013-08-28 DIAGNOSIS — W19XXXA Unspecified fall, initial encounter: Secondary | ICD-10-CM

## 2013-08-28 DIAGNOSIS — E11319 Type 2 diabetes mellitus with unspecified diabetic retinopathy without macular edema: Secondary | ICD-10-CM | POA: Diagnosis present

## 2013-08-28 DIAGNOSIS — R634 Abnormal weight loss: Secondary | ICD-10-CM | POA: Diagnosis present

## 2013-08-28 DIAGNOSIS — R630 Anorexia: Secondary | ICD-10-CM | POA: Diagnosis present

## 2013-08-28 DIAGNOSIS — M25561 Pain in right knee: Secondary | ICD-10-CM

## 2013-08-28 HISTORY — DX: Hyperlipidemia, unspecified: E78.5

## 2013-08-28 HISTORY — DX: Unspecified dementia, unspecified severity, without behavioral disturbance, psychotic disturbance, mood disturbance, and anxiety: F03.90

## 2013-08-28 HISTORY — DX: Cerebral infarction, unspecified: I63.9

## 2013-08-28 MED ORDER — POTASSIUM CHLORIDE CRYS ER 20 MEQ PO TBCR
40.0000 meq | EXTENDED_RELEASE_TABLET | Freq: Once | ORAL | Status: AC
  Administered 2013-08-28: 40 meq via ORAL
  Filled 2013-08-28: qty 2

## 2013-08-28 MED ORDER — POTASSIUM CHLORIDE CRYS ER 20 MEQ PO TBCR: 20.0000 meq | EXTENDED_RELEASE_TABLET | Freq: Two times a day (BID) | ORAL | Status: DC

## 2013-08-28 NOTE — ED Notes (Signed)
NT at the bedside. Dressing patient to go home.

## 2013-08-28 NOTE — ED Provider Notes (Signed)
CSN: [id number]     Arrival date & time 08/28/13  1014 History   First MD Initiated Contact with Patient 08/28/13 1042     Chief Complaint  Patient presents with  . Fall  . Knee Pain     (Consider location/radiation/quality/duration/timing/severity/associated sxs/prior Treatment) HPI .Marland Kitchen... level V caveat for dementia.  History obtained from daughter:  Patient has frequent falls. This is not unusual. She complains of bilateral knee pain. Status post right hip fracture January 2015.  No head or neck trauma. Daughter reports normal mental status. She is afebrile. No recent illnesses.   Past Medical History  Diagnosis Date  . Osteoporosis   . Diabetes mellitus without complication   . GERD (gastroesophageal reflux disease)   . Back pain   . Hypertension   . Arthritis   . Hepatitis     as a child  . Crohn's disease   . CHF (congestive heart failure)   . Neuropathy   . History of recurrent UTIs   . Carpal tunnel syndrome   . Hyperlipidemia   . Dementia   . Stroke    Past Surgical History  Procedure Laterality Date  . Knee surgery Right   . Elbow surgery Left   . Abdominal surgery    . Eye surgery      bilateral cataracts  . Colonoscopy    . Hardware removal Left 02/09/2013    Procedure: HARDWARE REMOVAL, IRRIGATION AND DEBRIDEMENT LEFT ELBOW;  Surgeon: Cheral Almas, MD;  Location: [facility] OR;  Service: Orthopedics;  Laterality: Left;  . Intramedullary (im) nail intertrochanteric Right 03/25/2013    Procedure: INTRAMEDULLARY (IM) NAIL RIGHT INTERTROCHANTRIC HIP FRACTURE;  Surgeon: Cheral Almas, MD;  Location: [facility] OR;  Service: Orthopedics;  Laterality: Right;  . Open reduction internal fixation (orif) distal radial fracture Right 03/25/2013    Procedure: OPEN REDUCTION INTERNAL FIXATION (ORIF) RIGHT DISTAL RADIUS FRACTURE;  Surgeon: Cheral Almas, MD;  Location: [facility] OR;  Service: Orthopedics;  Laterality: Right;   Family History  Problem Relation Age of Onset  .  Stroke Father   . Heart Problems Mother    History  Substance Use Topics  . Smoking status: Former Games developer  . Smokeless tobacco: Never Used     Comment: Quit in 1990  . Alcohol Use: No   OB History   Grav Para Term Preterm Abortions TAB SAB Ect Mult Living                 Review of Systems  Unable to perform ROS: Dementia      Allergies  Review of patient's allergies indicates no known allergies.  Home Medications   Prior to Admission medications   Medication Sig Start Date End Date Taking? Authorizing Provider  amLODipine (NORVASC) 5 MG tablet Take 5 mg by mouth daily.    Historical Provider, MD  aspirin EC 325 MG tablet Take 81 mg by mouth once. 03/25/13   Naiping Glee Arvin, MD  atenolol (TENORMIN) 25 MG tablet Take 25 mg by mouth daily.    Historical Provider, MD  Cholecalciferol (VITAMIN D3) 2000 UNITS TABS Take 2,000 Units by mouth daily.     Historical Provider, MD  feeding supplement, GLUCERNA SHAKE, (GLUCERNA SHAKE) LIQD Take 237 mLs by mouth daily. 03/27/13   Joseph Art, DO  furosemide (LASIX) 20 MG tablet Take 20 mg by mouth every other day.    Historical Provider, MD  insulin aspart (NOVOLOG) 100 UNIT/ML injection Inject 0-5 Units into the  skin at bedtime. 03/27/13   Joseph ArtJessica U Vann, DO  insulin aspart (NOVOLOG) 100 UNIT/ML injection Inject 0-9 Units into the skin 3 (three) times daily with meals. 03/27/13   Joseph ArtJessica U Vann, DO  insulin glargine (LANTUS) 100 UNIT/ML injection Inject 0.22 mLs (22 Units total) into the skin daily before breakfast. 03/27/13   Joseph ArtJessica U Vann, DO  lansoprazole (PREVACID) 30 MG capsule Take 30 mg by mouth daily.    Historical Provider, MD  OxyCODONE HCl, Abuse Deter, 5 MG TABA 1/2 to one tab as needed 06/23/13   Levert FeinsteinYijun Yan, MD  OxyCODONE HCl, Abuse Deter, 5 MG TABA 1/2 to one tab as needed daily 06/23/13   Levert FeinsteinYijun Yan, MD  potassium chloride SA (K-DUR,KLOR-CON) 20 MEQ tablet Take 1 tablet (20 mEq total) by mouth 2 (two) times daily. 08/28/13    Donnetta HutchingBrian Cook, MD  promethazine (PHENERGAN) 12.5 MG tablet Take 12.5 mg by mouth every 6 (six) hours as needed for nausea or vomiting.    Historical Provider, MD  QUEtiapine (SEROQUEL) 25 MG tablet 1/2 to one tab po qhs 06/23/13   Levert FeinsteinYijun Yan, MD   BP 152/67  Pulse 82  Temp(Src) 98.1 F (36.7 C) (Oral)  Resp 16  Ht 4\' 7"  (1.397 m)  Wt 100 lb (45.36 kg)  BMI 23.24 kg/m2  SpO2 94% Physical Exam  Nursing note and vitals reviewed. Constitutional:  Pleasant, conversant, demented.  HENT:  Head: Normocephalic and atraumatic.  Eyes: Conjunctivae and EOM are normal. Pupils are equal, round, and reactive to light.  Neck: Normal range of motion. Neck supple.  Cardiovascular: Normal rate, regular rhythm and normal heart sounds.   Pulmonary/Chest: Effort normal and breath sounds normal.  Abdominal: Soft. Bowel sounds are normal.  Musculoskeletal:  Tender anterior aspect of bilateral knees  Neurological: She is alert.  Patient is demented, but daughter reports normal mental status  Skin: Skin is warm and dry.  Psychiatric: She has a normal mood and affect.    ED Course  Procedures (including critical care time) Labs Review Labs Reviewed - No data to display  Imaging Review Dg Pelvis 1-2 Views  08/28/2013   CLINICAL DATA:  Pelvic soreness post fall, BILATERAL knee pain RIGHT greater than LEFT  EXAM: PELVIS - 1-2 VIEW  COMPARISON:  03/25/2013  FINDINGS: IM nail and compression screws at proximal RIGHT femur post ORIF of an intertrochanteric fracture.  Hardware incompletely visualized.  Diffuse osseous demineralization.  Prior vertebroplasty of L4.  Hip joints and SI joints grossly preserved.  No acute fracture, dislocation or bone destruction.  Question old healed fracture of the LEFT pubic body.  Scattered vascular calcifications.  IMPRESSION: Osseous demineralization with prior L4 vertebroplasty and proximal RIGHT femoral ORIF.  No definite acute abnormalities.  Suspect old healed LEFT pubic body  fracture.   Electronically Signed   By: Ulyses SouthwardMark  Boles M.D.   On: 08/28/2013 12:24   Dg Knee Complete 4 Views Left  08/28/2013   CLINICAL DATA:  Fall with bilateral knee pain right worse than left.  EXAM: LEFT KNEE - COMPLETE 4+ VIEW  COMPARISON:  None.  FINDINGS: There is diffuse decreased bone mineralization. There is no evidence of fracture or dislocation. No evidence of joint effusion. Minimal osteoarthritic changes present. There is moderate atherosclerotic disease.  IMPRESSION: No acute findings.   Electronically Signed   By: Elberta Fortisaniel  Boyle M.D.   On: 08/28/2013 12:22   Dg Knee Complete 4 Views Right  08/28/2013   CLINICAL DATA:  Traumatic injury  and pain  EXAM: RIGHT KNEE - COMPLETE 4+ VIEW  COMPARISON:  03/25/2013  FINDINGS: There are changes consistent with a prior chronic lateral tibial plateau fracture. A medullary rod is noted within the distal femur. Diffuse vascular calcifications are seen. No joint effusion or acute fracture is noted.  IMPRESSION: Chronic changes without acute abnormality.   Electronically Signed   By: Alcide CleverMark  Lukens M.D.   On: 08/28/2013 12:21     EKG Interpretation None      MDM   Final diagnoses:  Fall, initial encounter  Bilateral knee pain  Hypokalemia    X-ray of bilateral knees and pelvis show no fracture. Potassium 2.9.   Will replace potassium orally. Patient has primary care followup.    Donnetta HutchingBrian Cook, MD 08/28/13 1329

## 2013-08-28 NOTE — ED Notes (Signed)
Patient returned from X-ray 

## 2013-08-28 NOTE — Discharge Instructions (Signed)
X-rays of both knees and pelvis show no fracture. Your potassium was slightly low. Prescription for potassium for 10 days. Followup your primary care Dr. for a recheck of your potassium level next week.

## 2013-08-28 NOTE — ED Notes (Addendum)
Pt from home via [facility] with c/o of bilateral knee pain s/p witnessed fall yesterday from a standing position, now unable to bear weight on either leg.  Pt uses a walker s/p hip fx in January.  Daughter states she stopped giving the pt her lasix, amlodipine, aspirin, and atenolol x 1 month because she didn't feel the pt needed it any longer.  No deformity noted to knees or legs per EMS, some swelling to left knee noted.  Denies head injury.  Pt vomited x 2, daughter states this occurs with anxiety, given 4 mg Zofran.  Pt in NAD, alert.

## 2013-08-30 ENCOUNTER — Inpatient Hospital Stay (HOSPITAL_COMMUNITY)
Admission: EM | Admit: 2013-08-30 | Discharge: 2013-09-03 | DRG: 641 | Disposition: A | Discharge status: Hospice | Payer: Medicare Other | Attending: Internal Medicine | Admitting: Internal Medicine

## 2013-08-30 ENCOUNTER — Encounter (HOSPITAL_COMMUNITY): Payer: Self-pay | Admitting: Emergency Medicine

## 2013-08-30 ENCOUNTER — Emergency Department (HOSPITAL_COMMUNITY): Payer: Medicare Other

## 2013-08-30 DIAGNOSIS — E870 Hyperosmolality and hypernatremia: Secondary | ICD-10-CM | POA: Diagnosis present

## 2013-08-30 DIAGNOSIS — E872 Acidosis, unspecified: Secondary | ICD-10-CM | POA: Diagnosis present

## 2013-08-30 DIAGNOSIS — G8929 Other chronic pain: Secondary | ICD-10-CM | POA: Diagnosis present

## 2013-08-30 DIAGNOSIS — S72009A Fracture of unspecified part of neck of unspecified femur, initial encounter for closed fracture: Secondary | ICD-10-CM

## 2013-08-30 DIAGNOSIS — E873 Alkalosis: Secondary | ICD-10-CM | POA: Diagnosis not present

## 2013-08-30 DIAGNOSIS — R64 Cachexia: Secondary | ICD-10-CM | POA: Diagnosis present

## 2013-08-30 DIAGNOSIS — Z87440 Personal history of urinary (tract) infections: Secondary | ICD-10-CM | POA: Diagnosis not present

## 2013-08-30 DIAGNOSIS — E785 Hyperlipidemia, unspecified: Secondary | ICD-10-CM | POA: Diagnosis present

## 2013-08-30 DIAGNOSIS — E1139 Type 2 diabetes mellitus with other diabetic ophthalmic complication: Secondary | ICD-10-CM | POA: Diagnosis present

## 2013-08-30 DIAGNOSIS — K922 Gastrointestinal hemorrhage, unspecified: Secondary | ICD-10-CM | POA: Diagnosis present

## 2013-08-30 DIAGNOSIS — F0390 Unspecified dementia without behavioral disturbance: Secondary | ICD-10-CM | POA: Diagnosis present

## 2013-08-30 DIAGNOSIS — G629 Polyneuropathy, unspecified: Secondary | ICD-10-CM | POA: Diagnosis present

## 2013-08-30 DIAGNOSIS — I10 Essential (primary) hypertension: Secondary | ICD-10-CM | POA: Diagnosis present

## 2013-08-30 DIAGNOSIS — Z794 Long term (current) use of insulin: Secondary | ICD-10-CM | POA: Diagnosis not present

## 2013-08-30 DIAGNOSIS — F411 Generalized anxiety disorder: Secondary | ICD-10-CM | POA: Diagnosis present

## 2013-08-30 DIAGNOSIS — I509 Heart failure, unspecified: Secondary | ICD-10-CM | POA: Diagnosis present

## 2013-08-30 DIAGNOSIS — E876 Hypokalemia: Secondary | ICD-10-CM

## 2013-08-30 DIAGNOSIS — K219 Gastro-esophageal reflux disease without esophagitis: Secondary | ICD-10-CM | POA: Diagnosis present

## 2013-08-30 DIAGNOSIS — Z8673 Personal history of transient ischemic attack (TIA), and cerebral infarction without residual deficits: Secondary | ICD-10-CM | POA: Diagnosis not present

## 2013-08-30 DIAGNOSIS — R112 Nausea with vomiting, unspecified: Secondary | ICD-10-CM | POA: Diagnosis present

## 2013-08-30 DIAGNOSIS — Z515 Encounter for palliative care: Secondary | ICD-10-CM | POA: Diagnosis not present

## 2013-08-30 DIAGNOSIS — R634 Abnormal weight loss: Secondary | ICD-10-CM | POA: Diagnosis present

## 2013-08-30 DIAGNOSIS — M129 Arthropathy, unspecified: Secondary | ICD-10-CM | POA: Diagnosis present

## 2013-08-30 DIAGNOSIS — G589 Mononeuropathy, unspecified: Secondary | ICD-10-CM | POA: Diagnosis present

## 2013-08-30 DIAGNOSIS — I4891 Unspecified atrial fibrillation: Secondary | ICD-10-CM | POA: Diagnosis present

## 2013-08-30 DIAGNOSIS — R451 Restlessness and agitation: Secondary | ICD-10-CM

## 2013-08-30 DIAGNOSIS — R0600 Dyspnea, unspecified: Secondary | ICD-10-CM

## 2013-08-30 DIAGNOSIS — K920 Hematemesis: Secondary | ICD-10-CM

## 2013-08-30 DIAGNOSIS — N179 Acute kidney failure, unspecified: Secondary | ICD-10-CM | POA: Diagnosis not present

## 2013-08-30 DIAGNOSIS — G5600 Carpal tunnel syndrome, unspecified upper limb: Secondary | ICD-10-CM | POA: Diagnosis present

## 2013-08-30 DIAGNOSIS — Z681 Body mass index (BMI) 19 or less, adult: Secondary | ICD-10-CM | POA: Diagnosis not present

## 2013-08-30 DIAGNOSIS — Z66 Do not resuscitate: Secondary | ICD-10-CM | POA: Diagnosis present

## 2013-08-30 DIAGNOSIS — M810 Age-related osteoporosis without current pathological fracture: Secondary | ICD-10-CM | POA: Diagnosis present

## 2013-08-30 DIAGNOSIS — R296 Repeated falls: Secondary | ICD-10-CM

## 2013-08-30 DIAGNOSIS — Z9181 History of falling: Secondary | ICD-10-CM | POA: Diagnosis not present

## 2013-08-30 DIAGNOSIS — R630 Anorexia: Secondary | ICD-10-CM | POA: Diagnosis present

## 2013-08-30 DIAGNOSIS — R413 Other amnesia: Secondary | ICD-10-CM | POA: Diagnosis present

## 2013-08-30 DIAGNOSIS — E11319 Type 2 diabetes mellitus with unspecified diabetic retinopathy without macular edema: Secondary | ICD-10-CM | POA: Diagnosis present

## 2013-08-30 DIAGNOSIS — R627 Adult failure to thrive: Secondary | ICD-10-CM | POA: Diagnosis present

## 2013-08-30 DIAGNOSIS — M549 Dorsalgia, unspecified: Secondary | ICD-10-CM | POA: Diagnosis present

## 2013-08-30 DIAGNOSIS — S72001A Fracture of unspecified part of neck of right femur, initial encounter for closed fracture: Secondary | ICD-10-CM

## 2013-08-30 DIAGNOSIS — E119 Type 2 diabetes mellitus without complications: Secondary | ICD-10-CM | POA: Diagnosis present

## 2013-08-30 DIAGNOSIS — Z87891 Personal history of nicotine dependence: Secondary | ICD-10-CM | POA: Diagnosis not present

## 2013-08-30 DIAGNOSIS — Z823 Family history of stroke: Secondary | ICD-10-CM | POA: Diagnosis not present

## 2013-08-30 HISTORY — DX: Vomiting, unspecified: R11.10

## 2013-08-30 LAB — URINE MICROSCOPIC-ADD ON

## 2013-08-30 LAB — PROTIME-INR
INR: 1.06 (ref 0.00–1.49)
Prothrombin Time: 13.8 seconds (ref 11.6–15.2)

## 2013-08-30 LAB — GLUCOSE, CAPILLARY
GLUCOSE-CAPILLARY: 241 mg/dL — AB (ref 70–99)
GLUCOSE-CAPILLARY: 172 mg/dL — AB (ref 70–99)
Glucose-Capillary: 246 mg/dL — ABNORMAL HIGH (ref 70–99)
Glucose-Capillary: 243 mg/dL — ABNORMAL HIGH (ref 70–99)
Glucose-Capillary: 216 mg/dL — ABNORMAL HIGH (ref 70–99)
Glucose-Capillary: 148 mg/dL — ABNORMAL HIGH (ref 70–99)

## 2013-08-30 LAB — CBC
HCT: 43.9 % (ref 36.0–46.0)
Hemoglobin: 14.5 g/dL (ref 12.0–15.0)
MCH: 28.5 pg (ref 26.0–34.0)
MCHC: 33 g/dL (ref 30.0–36.0)
MCV: 86.2 fL (ref 78.0–100.0)
Platelets: 338 10*3/uL (ref 150–400)
RBC: 5.09 MIL/uL (ref 3.87–5.11)
RDW: 13 % (ref 11.5–15.5)
WBC: 14.5 10*3/uL — ABNORMAL HIGH (ref 4.0–10.5)

## 2013-08-30 LAB — BASIC METABOLIC PANEL
BUN: 29 mg/dL — AB (ref 6–23)
BUN: 31 mg/dL — ABNORMAL HIGH (ref 6–23)
BUN: 33 mg/dL — ABNORMAL HIGH (ref 6–23)
BUN: 34 mg/dL — AB (ref 6–23)
CALCIUM: 9.7 mg/dL (ref 8.4–10.5)
CALCIUM: 9.4 mg/dL (ref 8.4–10.5)
CHLORIDE: 99 meq/L (ref 96–112)
CO2: 37 mEq/L — ABNORMAL HIGH (ref 19–32)
CO2: 38 mEq/L — ABNORMAL HIGH (ref 19–32)
CO2: 42 mEq/L (ref 19–32)
CO2: 40 mEq/L (ref 19–32)
CREATININE: 0.75 mg/dL (ref 0.50–1.10)
CREATININE: 1 mg/dL (ref 0.50–1.10)
Calcium: 9.6 mg/dL (ref 8.4–10.5)
Calcium: 9.4 mg/dL (ref 8.4–10.5)
Chloride: 96 mEq/L (ref 96–112)
Chloride: 96 mEq/L (ref 96–112)
Chloride: 97 mEq/L (ref 96–112)
Creatinine, Ser: 1.2 mg/dL — ABNORMAL HIGH (ref 0.50–1.10)
Creatinine, Ser: 1.2 mg/dL — ABNORMAL HIGH (ref 0.50–1.10)
GFR calc Af Amer: 60 mL/min — ABNORMAL LOW (ref >90)
GFR calc Af Amer: 48 mL/min — ABNORMAL LOW (ref >90)
GFR calc non Af Amer: 52 mL/min — ABNORMAL LOW (ref >90)
GFR calc non Af Amer: 42 mL/min — ABNORMAL LOW (ref >90)
GFR, EST AFRICAN AMERICAN: 48 mL/min — AB (ref >90)
GFR, EST NON AFRICAN AMERICAN: 78 mL/min — AB (ref >90)
GFR, EST NON AFRICAN AMERICAN: 42 mL/min — AB (ref >90)
GLUCOSE: 293 mg/dL — AB (ref 70–99)
Glucose, Bld: 428 mg/dL — ABNORMAL HIGH (ref 70–99)
Glucose, Bld: 318 mg/dL — ABNORMAL HIGH (ref 70–99)
Glucose, Bld: 244 mg/dL — ABNORMAL HIGH (ref 70–99)
Potassium: 3.2 mEq/L — ABNORMAL LOW (ref 3.7–5.3)
Potassium: 3.1 mEq/L — ABNORMAL LOW (ref 3.7–5.3)
Potassium: 3.5 mEq/L — ABNORMAL LOW (ref 3.7–5.3)
Potassium: 3.9 mEq/L (ref 3.7–5.3)
SODIUM: 150 meq/L — AB (ref 137–147)
SODIUM: 153 meq/L — AB (ref 137–147)
SODIUM: 150 meq/L — AB (ref 137–147)
Sodium: 150 mEq/L — ABNORMAL HIGH (ref 137–147)

## 2013-08-30 LAB — COMPREHENSIVE METABOLIC PANEL
ALBUMIN: 3.9 g/dL (ref 3.5–5.2)
ALT: 10 U/L (ref 0–35)
ALT: 9 U/L (ref 0–35)
AST: 12 U/L (ref 0–37)
AST: 11 U/L (ref 0–37)
Albumin: 3.8 g/dL (ref 3.5–5.2)
Alkaline Phosphatase: 107 U/L (ref 39–117)
Alkaline Phosphatase: 103 U/L (ref 39–117)
BILIRUBIN TOTAL: 0.4 mg/dL (ref 0.3–1.2)
BUN: 24 mg/dL — AB (ref 6–23)
BUN: 28 mg/dL — ABNORMAL HIGH (ref 6–23)
CALCIUM: 9.3 mg/dL (ref 8.4–10.5)
CHLORIDE: 93 meq/L — AB (ref 96–112)
CO2: 35 meq/L — AB (ref 19–32)
CO2: 36 mEq/L — ABNORMAL HIGH (ref 19–32)
CREATININE: 0.7 mg/dL (ref 0.50–1.10)
Calcium: 9.5 mg/dL (ref 8.4–10.5)
Chloride: 93 mEq/L — ABNORMAL LOW (ref 96–112)
Creatinine, Ser: 0.52 mg/dL (ref 0.50–1.10)
GFR calc Af Amer: >90 mL/min (ref >90)
GFR calc Af Amer: >90 mL/min (ref >90)
GFR, EST NON AFRICAN AMERICAN: 88 mL/min — AB (ref >90)
GFR, EST NON AFRICAN AMERICAN: 80 mL/min — AB (ref >90)
Glucose, Bld: 473 mg/dL — ABNORMAL HIGH (ref 70–99)
Glucose, Bld: 486 mg/dL — ABNORMAL HIGH (ref 70–99)
Potassium: 3.3 mEq/L — ABNORMAL LOW (ref 3.7–5.3)
Potassium: 3.3 mEq/L — ABNORMAL LOW (ref 3.7–5.3)
Sodium: 150 mEq/L — ABNORMAL HIGH (ref 137–147)
Sodium: 150 mEq/L — ABNORMAL HIGH (ref 137–147)
Total Bilirubin: 0.3 mg/dL (ref 0.3–1.2)
Total Protein: 8 g/dL (ref 6.0–8.3)
Total Protein: 8 g/dL (ref 6.0–8.3)

## 2013-08-30 LAB — CBC WITH DIFFERENTIAL/PLATELET
BASOS ABS: 0 10*3/uL (ref 0.0–0.1)
Basophils Relative: 0 % (ref 0–1)
Eosinophils Absolute: 0 10*3/uL (ref 0.0–0.7)
Eosinophils Relative: 0 % (ref 0–5)
HEMATOCRIT: 45.1 % (ref 36.0–46.0)
HEMOGLOBIN: 15 g/dL (ref 12.0–15.0)
LYMPHS PCT: 5 % — AB (ref 12–46)
Lymphs Abs: 0.6 10*3/uL — ABNORMAL LOW (ref 0.7–4.0)
MCH: 28.8 pg (ref 26.0–34.0)
MCHC: 33.3 g/dL (ref 30.0–36.0)
MCV: 86.6 fL (ref 78.0–100.0)
MONO ABS: 0.8 10*3/uL (ref 0.1–1.0)
MONOS PCT: 6 % (ref 3–12)
NEUTROS ABS: 12.1 10*3/uL — AB (ref 1.7–7.7)
NEUTROS PCT: 89 % — AB (ref 43–77)
Platelets: 317 10*3/uL (ref 150–400)
RBC: 5.21 MIL/uL — AB (ref 3.87–5.11)
RDW: 12.8 % (ref 11.5–15.5)
WBC: 13.6 10*3/uL — AB (ref 4.0–10.5)

## 2013-08-30 LAB — URINALYSIS, ROUTINE W REFLEX MICROSCOPIC
BILIRUBIN URINE: NEGATIVE
Leukocytes, UA: NEGATIVE
Nitrite: NEGATIVE
PH: 5 (ref 5.0–8.0)
Protein, ur: 100 mg/dL — AB
Specific Gravity, Urine: 1.027 (ref 1.005–1.030)
Urobilinogen, UA: 0.2 mg/dL (ref 0.0–1.0)

## 2013-08-30 LAB — TYPE AND SCREEN
ABO/RH(D): B POS
Antibody Screen: NEGATIVE

## 2013-08-30 LAB — CBG MONITORING, ED: GLUCOSE-CAPILLARY: 440 mg/dL — AB (ref 70–99)

## 2013-08-30 LAB — I-STAT ARTERIAL BLOOD GAS, ED
Acid-Base Excess: 19 mmol/L — ABNORMAL HIGH (ref 0.0–2.0)
BICARBONATE: 43 meq/L — AB (ref 20.0–24.0)
O2 Saturation: 88 %
PH ART: 7.621 — AB (ref 7.350–7.450)
Patient temperature: 97.1
TCO2: 44 mmol/L (ref 0–100)
pCO2 arterial: 41.5 mmHg (ref 35.0–45.0)
pO2, Arterial: 43 mmHg — ABNORMAL LOW (ref 80.0–100.0)

## 2013-08-30 LAB — MRSA PCR SCREENING: MRSA by PCR: NEGATIVE

## 2013-08-30 LAB — TROPONIN I: Troponin I: <0.3 ng/mL (ref <0.30)

## 2013-08-30 LAB — POC OCCULT BLOOD, ED: FECAL OCCULT BLD: NEGATIVE

## 2013-08-30 LAB — LACTIC ACID, PLASMA: LACTIC ACID, VENOUS: 3.5 mmol/L — AB (ref 0.5–2.2)

## 2013-08-30 MED ORDER — SODIUM CHLORIDE 0.45 % IV SOLN
INTRAVENOUS | Status: DC
  Administered 2013-08-30 – 2013-09-01 (×2): via INTRAVENOUS

## 2013-08-30 MED ORDER — ONDANSETRON HCL 4 MG/2ML IJ SOLN
4.0000 mg | Freq: Once | INTRAMUSCULAR | Status: AC
  Administered 2013-08-30: 4 mg via INTRAVENOUS
  Filled 2013-08-30: qty 2

## 2013-08-30 MED ORDER — KCL IN DEXTROSE-NACL 20-5-0.9 MEQ/L-%-% IV SOLN: INTRAVENOUS | Status: DC

## 2013-08-30 MED ORDER — DEXTROSE-NACL 5-0.45 % IV SOLN
INTRAVENOUS | Status: DC
  Administered 2013-08-30 – 2013-08-31 (×2): via INTRAVENOUS

## 2013-08-30 MED ORDER — INSULIN ASPART 100 UNIT/ML ~~LOC~~ SOLN
15.0000 [IU] | Freq: Once | SUBCUTANEOUS | Status: AC
  Administered 2013-08-30: 15 [IU] via SUBCUTANEOUS
  Filled 2013-08-30: qty 1

## 2013-08-30 MED ORDER — POTASSIUM CHLORIDE 10 MEQ/100ML IV SOLN
10.0000 meq | INTRAVENOUS | Status: AC
  Administered 2013-08-30 (×4): 10 meq via INTRAVENOUS
  Filled 2013-08-30 (×4): qty 100

## 2013-08-30 MED ORDER — HEPARIN SODIUM (PORCINE) 5000 UNIT/ML IJ SOLN
5000.0000 [IU] | Freq: Three times a day (TID) | INTRAMUSCULAR | Status: DC
  Administered 2013-08-30 – 2013-08-31 (×5): 5000 [IU] via SUBCUTANEOUS
  Filled 2013-08-30 (×8): qty 1

## 2013-08-30 MED ORDER — DEXTROSE 50 % IV SOLN: 25.0000 mL | INTRAVENOUS | Status: DC | PRN

## 2013-08-30 MED ORDER — SODIUM CHLORIDE 0.9 % IV SOLN
INTRAVENOUS | Status: DC
  Administered 2013-08-30: 1.9 [IU]/h via INTRAVENOUS
  Administered 2013-08-30: 3.5 [IU]/h via INTRAVENOUS
  Administered 2013-08-30: 5.5 [IU]/h via INTRAVENOUS
  Administered 2013-08-30: 21:00:00 via INTRAVENOUS
  Administered 2013-08-31: 0.3 [IU]/h via INTRAVENOUS
  Filled 2013-08-30 (×2): qty 1

## 2013-08-30 MED ORDER — SODIUM CHLORIDE 0.9 % IV BOLUS (SEPSIS)
1000.0000 mL | Freq: Once | INTRAVENOUS | Status: AC
  Administered 2013-08-30: 1000 mL via INTRAVENOUS

## 2013-08-30 MED ORDER — HEPARIN SODIUM (PORCINE) 5000 UNIT/ML IJ SOLN
5000.0000 [IU] | Freq: Three times a day (TID) | INTRAMUSCULAR | Status: DC
  Filled 2013-08-30 (×9): qty 1

## 2013-08-30 MED ORDER — DEXTROSE 5 % IV SOLN
Freq: Once | INTRAVENOUS | Status: AC
  Administered 2013-08-30: 75 mL via INTRAVENOUS

## 2013-08-30 NOTE — Progress Notes (Signed)
RT note: MD notified of critical ABG

## 2013-08-30 NOTE — ED Notes (Signed)
Pt from home via [facility] with c/o dark emesis.  Pt daughter stated this started yesterday.  Pt was started on potassium PO which has a hx of aggravating her chron's disease.  Pt given 400 ml NS bolus.  Pt in NAD, alert at baseline, hx dementia.

## 2013-08-30 NOTE — ED Notes (Signed)
PT had another episode of emesis. Hospitalist changing room assignment to step-down

## 2013-08-30 NOTE — Progress Notes (Signed)
CRITICAL VALUE ALERT  Critical value received: CO2  Date of notification:  08/30/13  Time of notification:  1929  Critical value read back:Yes.    Nurse who received alert:  RG  MD notified (1st page):  [phone]   Time of first page:    MD notified (2nd page): [facility]  Time of second page:  Responding MD:   Time MD responded:

## 2013-08-30 NOTE — ED Notes (Signed)
PT had been holding O2 sat >95% on RA for about 20-3425mins and began to drop to 88% again. Placed back on 2 L [place] and O2 sat back up to 98-99%

## 2013-08-30 NOTE — H&P (Signed)
[facility] History and Physical  Janet Frazier JXB:[id number] DOB: 05/24/1930 DOA: 08/30/2013  Referring physician: ed PCP: Thayer HeadingsMACKENZIE,BRIAN, MD  Specialists: none  Chief Complaint: n/v dark vomit  HPI:   80 y/o ?, known h/o mod-severe dementia [last mmse 18/30 on 05/21/13], IDDM + retinopathy, Htn,chronic LBP-Hip surgery 03/25/13,multiple thoracic #s admitted from home with reported multiple episodes N/V-no reported diarrhea.  Patientunable to get full history.  Appears patient was in ED a couple of days ago with some pain in knee and ultimately came to the ED. Was given K tablets which makes her sick. Hasn't eaten much as she usually does-noticed sudden episodes of vomiting 6/27 pm and continued to have this as well- Daughter noted vomit in the bed-this appeared to be dark in nature and this worried her. Sugars haven't been checked at home but usually run in the 300's and when she was in rehab the insulin was increased She has a h/o of lows on her sugars recently as well Her urine always seems to be foul.   She has also been coughing as well wish is new ~ 1 day ago.  [place] 2 [place]-she has a h/o crohn's as well  Patient is confused at bedside and cannot give a good history Point care fecal occult negative Chest x-ray = nonobstructing bowel gas pattern potential opacity right upper chest anteriorly  Sodium 150 potassium 3.3 chloride 93 CO2 35 urine 24 creatinine 0.5 Exline baseline BUN/creatinine 11/2.4 Troponin point-of-care less than 0.3 Hemoglobin 15 WBC 13.6  Platelet count 317   Review of Systems: The patient cannot give h/o  Past Medical History  Diagnosis Date  . Osteoporosis   . Diabetes mellitus without complication   . GERD (gastroesophageal reflux disease)   . Back pain   . Hypertension   . Arthritis   . Hepatitis     as a child  . Crohn's disease   . CHF (congestive heart failure)   . Neuropathy   . History of recurrent UTIs   .  Carpal tunnel syndrome   . Hyperlipidemia   . Dementia   . Stroke   . Vomiting     associated with anxiety   Past Surgical History  Procedure Laterality Date  . Knee surgery Right   . Elbow surgery Left   . Abdominal surgery    . Eye surgery      bilateral cataracts  . Colonoscopy    . Hardware removal Left 02/09/2013    Procedure: HARDWARE REMOVAL, IRRIGATION AND DEBRIDEMENT LEFT ELBOW;  Surgeon: Cheral AlmasNaiping Michael Xu, MD;  Location: [facility] OR;  Service: Orthopedics;  Laterality: Left;  . Intramedullary (im) nail intertrochanteric Right 03/25/2013    Procedure: INTRAMEDULLARY (IM) NAIL RIGHT INTERTROCHANTRIC HIP FRACTURE;  Surgeon: Cheral AlmasNaiping Michael Xu, MD;  Location: [facility];  Service: Orthopedics;  Laterality: Right;  . 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   Retired   Right handed   Education some college   Caffeine none    No Known Allergies  Family History  Problem Relation Age of Onset  . Stroke Father   . Heart Problems Mother  alert but confusedMedication Sig Start Date End Date Taking? Authorizing Provider  insulin glargine (LANTUS) 100 UNIT/ML injection Inject 0.22 mLs (22 Units total) into the skin daily before breakfast. 03/27/13  Yes Joseph ArtJessica U Vann, DO  OxyCODONE HCl, Abuse Deter, 5 MG TABA Take 5 mg by mouth daily as needed (PAIN).   Yes Historical Provider, MD  QUEtiapine (SEROQUEL) 25 MG tablet 1/2 to one tab po qhs 06/23/13  Yes Levert FeinsteinYijun Yan, MD   Physical Exam: Filed Vitals:   08/30/13 1011 08/30/13 1130 08/30/13 1153 08/30/13 1230  BP: 137/72 111/60 111/60 123/53  Pulse:  109  104  Temp: 98 F (36.7 C) 97.1 F (36.2 C)    TempSrc:  Oral Rectal    Resp: 24 22 20 28   SpO2: 99% 98% 97% 96%     General:   alert but confused  Eyes:  EOMI NCAT no pallor   ENT:  soft supple no JVD no bruit   Neck:  see above  Cardiovascular: S1-S2 tachycardic  Respiratory: Clinically clear with no added sound   Abdomen:  slightly tender upper quadrant nondistended no rebound or guarding  Skin:  no lower extremity edema   Musculoskeletal:  range of motion intact  Psychiatric: Euthymic present   Neurologic:  confused but power 5/5 bilaterally in nature muscle groups dorsi plantarflexion are normal reflexes are brisk finger-nose-finger normal  Labs on Admission:  Basic Metabolic Panel:  Recent Labs Lab 08/30/13 1045  NA 150*  K 3.3*  CL 93*  CO2 35*  GLUCOSE 473*  BUN 24*  CREATININE 0.52  CALCIUM 9.5   Liver Function Tests:  Recent Labs Lab 08/30/13 1045  AST 12  ALT 10  ALKPHOS 107  BILITOT 0.4  PROT 8.0  ALBUMIN 3.9   No results found for this basename: LIPASE, AMYLASE,  in the last 168 hours No results found for this basename: AMMONIA,  in the last 168 hours CBC:  Recent Labs Lab 08/30/13 1045  WBC 13.6*  NEUTROABS 12.1*  HGB 15.0  HCT 45.1  MCV 86.6  PLT 317   Cardiac Enzymes:  Recent Labs Lab 08/30/13 1045  TROPONINI <0.30    BNP (last 3 results) No results found for this basename: PROBNP,  in the last 8760 hours CBG: No results found for this basename: GLUCAP,  in the last 168 hours  Radiological Exams on Admission: Dg Abd Acute W/chest  08/30/2013   CLINICAL DATA:  Vomiting  EXAM: ACUTE ABDOMEN SERIES (ABDOMEN 2 VIEW & CHEST 1 VIEW)  COMPARISON:  03/24/2013  FINDINGS: There is opacity at the medial right apex adjacent to the trachea. Hazy airspace disease in the remainder of the right upper lobe. Minimal linear atelectasis at the left base. Normal heart size. No pneumothorax.  There is no free intraperitoneal gas. Stabilization hardware in the right femur. Bone cement in L3 and  L4. Osteopenia. No disproportionate dilatation of bowel.  IMPRESSION: Nonobstructive bowel gas pattern.  Right apical pulmonary opacity as described. This may represent a airspace disease. Follow-up studies until resolution are recommended.   Electronically Signed   By: Maryclare BeanArt  Hoss M.D.   On: 08/30/2013 13:17    EKG: Independently reviewed.  sinus rhythm PR interval 0.12 right axis deviation large T waves with PVCs some inversions which are not completely new in 2 and 3 which resolve on repeat EKG   Assessment/Plan Principal Problem:   Nausea & vomiting-unclear etiology. Could be secondary to by mouth potassium which has given her intolerance  in the past but could also be secondary to high blood sugars and diabetic ketoacidosis. Have asked nursing to repeat the glucose in the emergency room and is still high in a setting of large glucose and ketones in the urine, we probably will will need to start insulin stabilizer. If her Gastroccult comes back positive I will involve GI in her care as she may need an endoscopy. She is not on any nonsteroidals but is on quetiapine which in some cases can cause bleeding    Acidosis, metabolic-anion gap calculated as being 22.  Primary process is is probably starvation ketosis however this could be diabetic ketoacidosis in addition her bicarbonate is 35 which is alkalotic so there may be a mixed disorder going on.  We will repeat basic metabolic panel, get a lactic acid and an ABG to help determine what his primary process if needed I will consult nephrology for assistance   Probable diabetic to acidosis-place on insulin stabilizer, patient will need half normal saline at 50 cc an hour.   Hypokalemia-for now replace with D5 containing K.   Essential hypertension, benign-currently none medications   Diabetes mellitus-see above discussion   Closed right hip fracture-h/o repair 03/2013-stable currently   Memory loss-follow with Neurology as OP  Time spent: 34 Discussed  with daugther SDU  Mahala Menghini [facility] Pager [phone]  If 7PM-7AM, please contact night-coverage [email or web address] Password [facility] 08/30/2013, 1:39 PM

## 2013-08-30 NOTE — ED Notes (Signed)
MD at bedside. 

## 2013-08-30 NOTE — Progress Notes (Signed)
Patient has elevated HCO as well as metabolic alkalosis on abg implying metabolic alkalosis PCO2=40 +0.7 [ [id number] nl] 40+0.7[38-24] 40+9.8 49.8 is expected HCO, but measured is 41.5   As AG is elevated, there is a concomittant respiratory component to her Clarisa Schoolsacidsosi  i think it is starvation ketosis  Because of tachgycasrdia and some mild hypotension i thnk she also has some ECFV depletion and the most sensitive test in this case is usually a Urine chloride which we will obtain to confirm diagnosis

## 2013-08-30 NOTE — ED Notes (Signed)
Admitting provider wants NG-tube instered

## 2013-08-30 NOTE — ED Notes (Signed)
PT appears to have some increased confusion from baseline-discussed with provider in room

## 2013-08-31 ENCOUNTER — Inpatient Hospital Stay (HOSPITAL_COMMUNITY): Payer: Medicare Other

## 2013-08-31 DIAGNOSIS — E870 Hyperosmolality and hypernatremia: Principal | ICD-10-CM

## 2013-08-31 LAB — BASIC METABOLIC PANEL
BUN: 43 mg/dL — ABNORMAL HIGH (ref 6–23)
BUN: 47 mg/dL — AB (ref 6–23)
CALCIUM: 8.9 mg/dL (ref 8.4–10.5)
CALCIUM: 8.7 mg/dL (ref 8.4–10.5)
CO2: 37 meq/L — AB (ref 19–32)
CO2: 38 mEq/L — ABNORMAL HIGH (ref 19–32)
CREATININE: 1.59 mg/dL — AB (ref 0.50–1.10)
Chloride: 96 mEq/L (ref 96–112)
Chloride: 98 mEq/L (ref 96–112)
Creatinine, Ser: 1.26 mg/dL — ABNORMAL HIGH (ref 0.50–1.10)
GFR calc Af Amer: 34 mL/min — ABNORMAL LOW (ref >90)
GFR, EST AFRICAN AMERICAN: 45 mL/min — AB (ref >90)
GFR, EST NON AFRICAN AMERICAN: 30 mL/min — AB (ref >90)
GFR, EST NON AFRICAN AMERICAN: 39 mL/min — AB (ref >90)
Glucose, Bld: 203 mg/dL — ABNORMAL HIGH (ref 70–99)
Glucose, Bld: 235 mg/dL — ABNORMAL HIGH (ref 70–99)
Potassium: 3.7 mEq/L (ref 3.7–5.3)
Potassium: 3.3 mEq/L — ABNORMAL LOW (ref 3.7–5.3)
SODIUM: 148 meq/L — AB (ref 137–147)
Sodium: 147 mEq/L (ref 137–147)

## 2013-08-31 LAB — GLUCOSE, CAPILLARY
GLUCOSE-CAPILLARY: 102 mg/dL — AB (ref 70–99)
GLUCOSE-CAPILLARY: 143 mg/dL — AB (ref 70–99)
GLUCOSE-CAPILLARY: 163 mg/dL — AB (ref 70–99)
GLUCOSE-CAPILLARY: 182 mg/dL — AB (ref 70–99)
GLUCOSE-CAPILLARY: 189 mg/dL — AB (ref 70–99)
GLUCOSE-CAPILLARY: 210 mg/dL — AB (ref 70–99)
GLUCOSE-CAPILLARY: 217 mg/dL — AB (ref 70–99)
GLUCOSE-CAPILLARY: 91 mg/dL (ref 70–99)
Glucose-Capillary: 116 mg/dL — ABNORMAL HIGH (ref 70–99)
Glucose-Capillary: 154 mg/dL — ABNORMAL HIGH (ref 70–99)

## 2013-08-31 LAB — COMPREHENSIVE METABOLIC PANEL
ALT: 8 U/L (ref 0–35)
AST: 19 U/L (ref 0–37)
Albumin: 3.5 g/dL (ref 3.5–5.2)
Alkaline Phosphatase: 87 U/L (ref 39–117)
BILIRUBIN TOTAL: 0.3 mg/dL (ref 0.3–1.2)
BUN: 40 mg/dL — ABNORMAL HIGH (ref 6–23)
CHLORIDE: 97 meq/L (ref 96–112)
CO2: 39 meq/L — AB (ref 19–32)
CREATININE: 1.64 mg/dL — AB (ref 0.50–1.10)
Calcium: 8.9 mg/dL (ref 8.4–10.5)
GFR calc Af Amer: 33 mL/min — ABNORMAL LOW (ref >90)
GFR, EST NON AFRICAN AMERICAN: 28 mL/min — AB (ref >90)
GLUCOSE: 136 mg/dL — AB (ref 70–99)
Potassium: 4 mEq/L (ref 3.7–5.3)
Sodium: 150 mEq/L — ABNORMAL HIGH (ref 137–147)
Total Protein: 7.2 g/dL (ref 6.0–8.3)

## 2013-08-31 LAB — CBC
HCT: 39.9 % (ref 36.0–46.0)
Hemoglobin: 12.7 g/dL (ref 12.0–15.0)
MCH: 28 pg (ref 26.0–34.0)
MCHC: 31.8 g/dL (ref 30.0–36.0)
MCV: 87.9 fL (ref 78.0–100.0)
PLATELETS: 341 10*3/uL (ref 150–400)
RBC: 4.54 MIL/uL (ref 3.87–5.11)
RDW: 13.3 % (ref 11.5–15.5)
WBC: 19.6 10*3/uL — ABNORMAL HIGH (ref 4.0–10.5)

## 2013-08-31 LAB — CHLORIDE, URINE, RANDOM: Chloride Urine: 29 mEq/L

## 2013-08-31 LAB — OCCULT BLOOD GASTRIC / DUODENUM (SPECIMEN CUP): Occult Blood, Gastric: POSITIVE — AB

## 2013-08-31 LAB — MAGNESIUM: Magnesium: 1.7 mg/dL (ref 1.5–2.5)

## 2013-08-31 MED ORDER — INSULIN DETEMIR 100 UNIT/ML ~~LOC~~ SOLN
10.0000 [IU] | Freq: Every day | SUBCUTANEOUS | Status: DC
  Administered 2013-08-31: 10 [IU] via SUBCUTANEOUS
  Filled 2013-08-31 (×2): qty 0.1

## 2013-08-31 MED ORDER — MORPHINE SULFATE 2 MG/ML IJ SOLN
0.5000 mg | INTRAMUSCULAR | Status: DC | PRN
  Administered 2013-08-31 – 2013-09-01 (×2): 0.5 mg via INTRAVENOUS
  Filled 2013-08-31 (×2): qty 1

## 2013-08-31 MED ORDER — INSULIN GLARGINE 100 UNIT/ML ~~LOC~~ SOLN
22.0000 [IU] | Freq: Every day | SUBCUTANEOUS | Status: DC
  Administered 2013-08-31: 22 [IU] via SUBCUTANEOUS
  Filled 2013-08-31: qty 0.22

## 2013-08-31 MED ORDER — PANTOPRAZOLE SODIUM 40 MG IV SOLR
40.0000 mg | Freq: Two times a day (BID) | INTRAVENOUS | Status: DC
  Administered 2013-08-31 – 2013-09-01 (×3): 40 mg via INTRAVENOUS
  Filled 2013-08-31 (×4): qty 40

## 2013-08-31 MED ORDER — INSULIN ASPART 100 UNIT/ML ~~LOC~~ SOLN
0.0000 [IU] | SUBCUTANEOUS | Status: DC
  Administered 2013-08-31: 2 [IU] via SUBCUTANEOUS
  Administered 2013-08-31 (×2): 3 [IU] via SUBCUTANEOUS
  Administered 2013-09-01: 1 [IU] via SUBCUTANEOUS
  Administered 2013-09-01: 2 [IU] via SUBCUTANEOUS
  Administered 2013-09-01: 1 [IU] via SUBCUTANEOUS

## 2013-08-31 MED ORDER — ONDANSETRON HCL 4 MG/2ML IJ SOLN
INTRAMUSCULAR | Status: AC
  Administered 2013-08-31: 09:00:00
  Filled 2013-08-31: qty 2

## 2013-08-31 MED ORDER — IOHEXOL 300 MG/ML  SOLN: 50.0000 mL | Freq: Once | INTRAMUSCULAR | Status: AC | PRN

## 2013-08-31 NOTE — Consult Note (Signed)
UNASSIGNED PATIENT Reason for Consult: Nausea, vomiting, coffee ground emesis. Referring Physician: THP.  Janet Frazier is an 80 y.o. female.  HPI: 80 year old white female, with multiple medical problems listed below, presented to the ER with intractable nausea, vomiting, CGE and a drop in the hemoglobin from 15-12.    Past Medical History  Diagnosis Date  . Osteoporosis   . Diabetes mellitus without complication   . GERD (gastroesophageal reflux disease)   . Back pain   . Hypertension   . Arthritis   . Hepatitis     as a child  . Crohn's disease   . CHF (congestive heart failure)   . Neuropathy   . History of recurrent UTIs   . Carpal tunnel syndrome   . Hyperlipidemia   . Dementia   . Stroke   . Vomiting     associated with anxiety   Past Surgical History  Procedure Laterality Date  . Knee surgery Right   . Elbow surgery Left   . Abdominal surgery    . Eye surgery      bilateral cataracts  . Colonoscopy    . Hardware removal Left 02/09/2013    Procedure: HARDWARE REMOVAL, IRRIGATION AND DEBRIDEMENT LEFT ELBOW;  Surgeon: Marianna Payment, MD;  Location: [facility];  Service: Orthopedics;  Laterality: Left;  . Intramedullary (im) nail intertrochanteric Right 03/25/2013    Procedure: INTRAMEDULLARY (IM) NAIL RIGHT INTERTROCHANTRIC HIP FRACTURE;  Surgeon: Marianna Payment, MD;  Location: [facility];  Service: Orthopedics;  Laterality: Right;  . Open reduction internal fixation (orif) distal radial fracture Right 03/25/2013    Procedure: OPEN REDUCTION INTERNAL FIXATION (ORIF) RIGHT DISTAL RADIUS FRACTURE;  Surgeon: Marianna Payment, MD;  Location: [facility];  Service: Orthopedics;  Laterality: Right;   Family History  Problem Relation Age of Onset  . Stroke Father   . Heart Problems Mother    Social History:  reports that she has quit smoking. She has never used smokeless tobacco. She reports that she does not drink alcohol or use illicit drugs.  Allergies: No Known  Allergies  Medications: I have reviewed the patient's current medications.  Results for orders placed during the hospital encounter of 08/30/13 (from the past 48 hour(s))  CBC WITH DIFFERENTIAL     Status: Abnormal   Collection Time    08/30/13 10:45 AM      Result Value Ref Range   WBC 13.6 (*) 4.0 - 10.5 K/uL   RBC 5.21 (*) 3.87 - 5.11 MIL/uL   Hemoglobin 15.0  12.0 - 15.0 g/dL   HCT 45.1  36.0 - 46.0 %   MCV 86.6  78.0 - 100.0 fL   MCH 28.8  26.0 - 34.0 pg   MCHC 33.3  30.0 - 36.0 g/dL   RDW 12.8  11.5 - 15.5 %   Platelets 317  150 - 400 K/uL   Neutrophils Relative % 89 (*) 43 - 77 %   Neutro Abs 12.1 (*) 1.7 - 7.7 K/uL   Lymphocytes Relative 5 (*) 12 - 46 %   Lymphs Abs 0.6 (*) 0.7 - 4.0 K/uL   Monocytes Relative 6  3 - 12 %   Monocytes Absolute 0.8  0.1 - 1.0 K/uL   Eosinophils Relative 0  0 - 5 %   Eosinophils Absolute 0.0  0.0 - 0.7 K/uL   Basophils Relative 0  0 - 1 %   Basophils Absolute 0.0  0.0 - 0.1 K/uL  COMPREHENSIVE METABOLIC  PANEL     Status: Abnormal   Collection Time    08/30/13 10:45 AM      Result Value Ref Range   Sodium 150 (*) 137 - 147 mEq/L   Potassium 3.3 (*) 3.7 - 5.3 mEq/L   Chloride 93 (*) 96 - 112 mEq/L   CO2 35 (*) 19 - 32 mEq/L   Glucose, Bld 473 (*) 70 - 99 mg/dL   BUN 24 (*) 6 - 23 mg/dL   Creatinine, Ser 0.52  0.50 - 1.10 mg/dL   Calcium 9.5  8.4 - 10.5 mg/dL   Total Protein 8.0  6.0 - 8.3 g/dL   Albumin 3.9  3.5 - 5.2 g/dL   AST 12  0 - 37 U/L   ALT 10  0 - 35 U/L   Alkaline Phosphatase 107  39 - 117 U/L   Total Bilirubin 0.4  0.3 - 1.2 mg/dL   GFR calc non Af Amer 88 (*) >90 mL/min   GFR calc Af Amer >90  >90 mL/min   Comment: (NOTE)     The eGFR has been calculated using the CKD EPI equation.     This calculation has not been validated in all clinical situations.     eGFR's persistently <90 mL/min signify possible Chronic Kidney     Disease.  TROPONIN I     Status: None   Collection Time    08/30/13 10:45 AM      Result  Value Ref Range   Troponin I <0.30  <0.30 ng/mL   Comment:            Due to the release kinetics of cTnI,     a negative result within the first hours     of the onset of symptoms does not rule out     myocardial infarction with certainty.     If myocardial infarction is still suspected,     repeat the test at appropriate intervals.  TYPE AND SCREEN     Status: None   Collection Time    08/30/13 10:45 AM      Result Value Ref Range   ABO/RH(D) B POS     Antibody Screen NEG     Sample Expiration 09/02/2013    URINALYSIS, ROUTINE W REFLEX MICROSCOPIC     Status: Abnormal   Collection Time    08/30/13 11:52 AM      Result Value Ref Range   Color, Urine YELLOW  YELLOW   APPearance TURBID (*) CLEAR   Specific Gravity, Urine 1.027  1.005 - 1.030   pH 5.0  5.0 - 8.0   Glucose, UA >1000 (*) NEGATIVE mg/dL   Hgb urine dipstick TRACE (*) NEGATIVE   Bilirubin Urine NEGATIVE  NEGATIVE   Ketones, ur >80 (*) NEGATIVE mg/dL   Protein, ur 100 (*) NEGATIVE mg/dL   Urobilinogen, UA 0.2  0.0 - 1.0 mg/dL   Nitrite NEGATIVE  NEGATIVE   Leukocytes, UA NEGATIVE  NEGATIVE  URINE MICROSCOPIC-ADD ON     Status: Abnormal   Collection Time    08/30/13 11:52 AM      Result Value Ref Range   Squamous Epithelial / LPF RARE  RARE   WBC, UA 3-6  <3 WBC/hpf   RBC / HPF 0-2  <3 RBC/hpf   Bacteria, UA MANY (*) RARE  POC OCCULT BLOOD, ED     Status: None   Collection Time    08/30/13 11:57 AM      Result  Value Ref Range   Fecal Occult Bld NEGATIVE  NEGATIVE  COMPREHENSIVE METABOLIC PANEL     Status: Abnormal   Collection Time    08/30/13  1:57 PM      Result Value Ref Range   Sodium 150 (*) 137 - 147 mEq/L   Potassium 3.3 (*) 3.7 - 5.3 mEq/L   Chloride 93 (*) 96 - 112 mEq/L   CO2 36 (*) 19 - 32 mEq/L   Glucose, Bld 486 (*) 70 - 99 mg/dL   BUN 28 (*) 6 - 23 mg/dL   Creatinine, Ser 0.70  0.50 - 1.10 mg/dL   Calcium 9.3  8.4 - 10.5 mg/dL   Total Protein 8.0  6.0 - 8.3 g/dL   Albumin 3.8  3.5 - 5.2  g/dL   AST 11  0 - 37 U/L   ALT 9  0 - 35 U/L   Alkaline Phosphatase 103  39 - 117 U/L   Total Bilirubin 0.3  0.3 - 1.2 mg/dL   GFR calc non Af Amer 80 (*) >90 mL/min   GFR calc Af Amer >90  >90 mL/min   Comment: (NOTE)     The eGFR has been calculated using the CKD EPI equation.     This calculation has not been validated in all clinical situations.     eGFR's persistently <90 mL/min signify possible Chronic Kidney     Disease.  PROTIME-INR     Status: None   Collection Time    08/30/13  1:57 PM      Result Value Ref Range   Prothrombin Time 13.8  11.6 - 15.2 seconds   INR 1.06  0.00 - 1.49  CBG MONITORING, ED     Status: Abnormal   Collection Time    08/30/13  2:28 PM      Result Value Ref Range   Glucose-Capillary 440 (*) 70 - 99 mg/dL  BASIC METABOLIC PANEL     Status: Abnormal   Collection Time    08/30/13  2:50 PM      Result Value Ref Range   Sodium 150 (*) 137 - 147 mEq/L   Potassium 3.2 (*) 3.7 - 5.3 mEq/L   Chloride 96  96 - 112 mEq/L   CO2 37 (*) 19 - 32 mEq/L   Glucose, Bld 428 (*) 70 - 99 mg/dL   BUN 29 (*) 6 - 23 mg/dL   Creatinine, Ser 0.75  0.50 - 1.10 mg/dL   Calcium 9.6  8.4 - 10.5 mg/dL   GFR calc non Af Amer 78 (*) >90 mL/min   GFR calc Af Amer >90  >90 mL/min   Comment: (NOTE)     The eGFR has been calculated using the CKD EPI equation.     This calculation has not been validated in all clinical situations.     eGFR's persistently <90 mL/min signify possible Chronic Kidney     Disease.  CBC     Status: Abnormal   Collection Time    08/30/13  2:50 PM      Result Value Ref Range   WBC 14.5 (*) 4.0 - 10.5 K/uL   RBC 5.09  3.87 - 5.11 MIL/uL   Hemoglobin 14.5  12.0 - 15.0 g/dL   HCT 43.9  36.0 - 46.0 %   MCV 86.2  78.0 - 100.0 fL   MCH 28.5  26.0 - 34.0 pg   MCHC 33.0  30.0 - 36.0 g/dL   RDW 13.0  11.5 -  15.5 %   Platelets 338  150 - 400 K/uL  I-STAT ARTERIAL BLOOD GAS, ED     Status: Abnormal   Collection Time    08/30/13  3:47 PM       Result Value Ref Range   pH, Arterial 7.621 (*) 7.350 - 7.450   pCO2 arterial 41.5  35.0 - 45.0 mmHg   pO2, Arterial 43.0 (*) 80.0 - 100.0 mmHg   Bicarbonate 43.0 (*) 20.0 - 24.0 mEq/L   TCO2 44  0 - 100 mmol/L   O2 Saturation 88.0     Acid-Base Excess 19.0 (*) 0.0 - 2.0 mmol/L   Patient temperature 97.1 F     Collection site RADIAL, ALLEN'S TEST ACCEPTABLE     Drawn by Operator     Sample type ARTERIAL     Comment NOTIFIED PHYSICIAN    MRSA PCR SCREENING     Status: None   Collection Time    08/30/13  4:36 PM      Result Value Ref Range   MRSA by PCR NEGATIVE  NEGATIVE   Comment:            The GeneXpert MRSA Assay (FDA     approved for NASAL specimens     only), is one component of a     comprehensive MRSA colonization     surveillance program. It is not     intended to diagnose MRSA     infection nor to guide or     monitor treatment for     MRSA infections.  LACTIC ACID, PLASMA     Status: Abnormal   Collection Time    08/30/13  4:59 PM      Result Value Ref Range   Lactic Acid, Venous 3.5 (*) 0.5 - 2.2 mmol/L  BASIC METABOLIC PANEL     Status: Abnormal   Collection Time    08/30/13  4:59 PM      Result Value Ref Range   Sodium 150 (*) 137 - 147 mEq/L   Potassium 3.1 (*) 3.7 - 5.3 mEq/L   Chloride 96  96 - 112 mEq/L   CO2 38 (*) 19 - 32 mEq/L   Glucose, Bld 318 (*) 70 - 99 mg/dL   BUN 31 (*) 6 - 23 mg/dL   Creatinine, Ser 1.00  0.50 - 1.10 mg/dL   Calcium 9.7  8.4 - 10.5 mg/dL   GFR calc non Af Amer 52 (*) >90 mL/min   GFR calc Af Amer 60 (*) >90 mL/min   Comment: (NOTE)     The eGFR has been calculated using the CKD EPI equation.     This calculation has not been validated in all clinical situations.     eGFR's persistently <90 mL/min signify possible Chronic Kidney     Disease.  GLUCOSE, CAPILLARY     Status: Abnormal   Collection Time    08/30/13  5:55 PM      Result Value Ref Range   Glucose-Capillary 246 (*) 70 - 99 mg/dL  BASIC METABOLIC PANEL      Status: Abnormal   Collection Time    08/30/13  6:22 PM      Result Value Ref Range   Sodium 153 (*) 137 - 147 mEq/L   Potassium 3.5 (*) 3.7 - 5.3 mEq/L   Chloride 97  96 - 112 mEq/L   CO2 42 (*) 19 - 32 mEq/L   Comment: CRITICAL RESULT CALLED TO, READ BACK BY AND VERIFIED  WITH:     Ronnald Collum RN [id number] EBANKS Peters, S   Glucose, Bld 293 (*) 70 - 99 mg/dL   BUN 33 (*) 6 - 23 mg/dL   Creatinine, Ser 1.20 (*) 0.50 - 1.10 mg/dL   Calcium 9.4  8.4 - 10.5 mg/dL   GFR calc non Af Amer 42 (*) >90 mL/min   GFR calc Af Amer 48 (*) >90 mL/min   Comment: (NOTE)     The eGFR has been calculated using the CKD EPI equation.     This calculation has not been validated in all clinical situations.     eGFR's persistently <90 mL/min signify possible Chronic Kidney     Disease.  GLUCOSE, CAPILLARY     Status: Abnormal   Collection Time    08/30/13  6:58 PM      Result Value Ref Range   Glucose-Capillary 241 (*) 70 - 99 mg/dL  GLUCOSE, CAPILLARY     Status: Abnormal   Collection Time    08/30/13  8:10 PM      Result Value Ref Range   Glucose-Capillary 243 (*) 70 - 99 mg/dL  BASIC METABOLIC PANEL     Status: Abnormal   Collection Time    08/30/13  8:28 PM      Result Value Ref Range   Sodium 150 (*) 137 - 147 mEq/L   Potassium 3.9  3.7 - 5.3 mEq/L   Chloride 99  96 - 112 mEq/L   CO2 40 (*) 19 - 32 mEq/L   Comment: CRITICAL RESULT CALLED TO, READ BACK BY AND VERIFIED WITH:     DUFFY S,RN 08/30/13 2116 WAYK   Glucose, Bld 244 (*) 70 - 99 mg/dL   BUN 34 (*) 6 - 23 mg/dL   Creatinine, Ser 1.20 (*) 0.50 - 1.10 mg/dL   Calcium 9.4  8.4 - 10.5 mg/dL   GFR calc non Af Amer 42 (*) >90 mL/min   GFR calc Af Amer 48 (*) >90 mL/min   Comment: (NOTE)     The eGFR has been calculated using the CKD EPI equation.     This calculation has not been validated in all clinical situations.     eGFR's persistently <90 mL/min signify possible Chronic Kidney     Disease.  GLUCOSE, CAPILLARY     Status:  Abnormal   Collection Time    08/30/13  9:03 PM      Result Value Ref Range   Glucose-Capillary 216 (*) 70 - 99 mg/dL  GLUCOSE, CAPILLARY     Status: Abnormal   Collection Time    08/30/13 10:02 PM      Result Value Ref Range   Glucose-Capillary 172 (*) 70 - 99 mg/dL  GLUCOSE, CAPILLARY     Status: Abnormal   Collection Time    08/30/13 11:05 PM      Result Value Ref Range   Glucose-Capillary 148 (*) 70 - 99 mg/dL  GLUCOSE, CAPILLARY     Status: Abnormal   Collection Time    08/31/13 12:13 AM      Result Value Ref Range   Glucose-Capillary 116 (*) 70 - 99 mg/dL  GLUCOSE, CAPILLARY     Status: Abnormal   Collection Time    08/31/13 12:58 AM      Result Value Ref Range   Glucose-Capillary 102 (*) 70 - 99 mg/dL  GLUCOSE, CAPILLARY     Status: None   Collection Time    08/31/13  1:55 AM  Result Value Ref Range   Glucose-Capillary 91  70 - 99 mg/dL  CBC     Status: Abnormal   Collection Time    08/31/13  2:45 AM      Result Value Ref Range   WBC 19.6 (*) 4.0 - 10.5 K/uL   RBC 4.54  3.87 - 5.11 MIL/uL   Hemoglobin 12.7  12.0 - 15.0 g/dL   HCT 39.9  36.0 - 46.0 %   MCV 87.9  78.0 - 100.0 fL   MCH 28.0  26.0 - 34.0 pg   MCHC 31.8  30.0 - 36.0 g/dL   RDW 13.3  11.5 - 15.5 %   Platelets 341  150 - 400 K/uL  COMPREHENSIVE METABOLIC PANEL     Status: Abnormal   Collection Time    08/31/13  2:45 AM      Result Value Ref Range   Sodium 150 (*) 137 - 147 mEq/L   Potassium 4.0  3.7 - 5.3 mEq/L   Chloride 97  96 - 112 mEq/L   CO2 39 (*) 19 - 32 mEq/L   Glucose, Bld 136 (*) 70 - 99 mg/dL   BUN 40 (*) 6 - 23 mg/dL   Creatinine, Ser 1.64 (*) 0.50 - 1.10 mg/dL   Calcium 8.9  8.4 - 10.5 mg/dL   Total Protein 7.2  6.0 - 8.3 g/dL   Albumin 3.5  3.5 - 5.2 g/dL   AST 19  0 - 37 U/L   ALT 8  0 - 35 U/L   Alkaline Phosphatase 87  39 - 117 U/L   Total Bilirubin 0.3  0.3 - 1.2 mg/dL   GFR calc non Af Amer 28 (*) >90 mL/min   GFR calc Af Amer 33 (*) >90 mL/min   Comment: (NOTE)      The eGFR has been calculated using the CKD EPI equation.     This calculation has not been validated in all clinical situations.     eGFR's persistently <90 mL/min signify possible Chronic Kidney     Disease.  GLUCOSE, CAPILLARY     Status: Abnormal   Collection Time    08/31/13  3:08 AM      Result Value Ref Range   Glucose-Capillary 143 (*) 70 - 99 mg/dL  GLUCOSE, CAPILLARY     Status: Abnormal   Collection Time    08/31/13  4:07 AM      Result Value Ref Range   Glucose-Capillary 154 (*) 70 - 99 mg/dL  GLUCOSE, CAPILLARY     Status: Abnormal   Collection Time    08/31/13  5:13 AM      Result Value Ref Range   Glucose-Capillary 163 (*) 70 - 99 mg/dL  OCCULT BLOOD GASTRIC / DUODENUM (SPECIMEN CUP)     Status: Abnormal   Collection Time    08/31/13  7:18 AM      Result Value Ref Range   pH, Gastric NOT DONE     Occult Blood, Gastric POSITIVE (*) NEGATIVE  CHLORIDE, URINE, RANDOM     Status: None   Collection Time    08/31/13  7:36 AM      Result Value Ref Range   Chloride Urine 29    BASIC METABOLIC PANEL     Status: Abnormal   Collection Time    08/31/13  8:30 AM      Result Value Ref Range   Sodium 147  137 - 147 mEq/L   Potassium 3.7  3.7 -  5.3 mEq/L   Chloride 96  96 - 112 mEq/L   CO2 37 (*) 19 - 32 mEq/L   Glucose, Bld 203 (*) 70 - 99 mg/dL   BUN 43 (*) 6 - 23 mg/dL   Creatinine, Ser 1.59 (*) 0.50 - 1.10 mg/dL   Calcium 8.9  8.4 - 10.5 mg/dL   GFR calc non Af Amer 30 (*) >90 mL/min   GFR calc Af Amer 34 (*) >90 mL/min   Comment: (NOTE)     The eGFR has been calculated using the CKD EPI equation.     This calculation has not been validated in all clinical situations.     eGFR's persistently <90 mL/min signify possible Chronic Kidney     Disease.  GLUCOSE, CAPILLARY     Status: Abnormal   Collection Time    08/31/13 10:16 AM      Result Value Ref Range   Glucose-Capillary 182 (*) 70 - 99 mg/dL  GLUCOSE, CAPILLARY     Status: Abnormal   Collection Time     08/31/13 12:04 PM      Result Value Ref Range   Glucose-Capillary 189 (*) 70 - 99 mg/dL  GLUCOSE, CAPILLARY     Status: Abnormal   Collection Time    08/31/13  4:04 PM      Result Value Ref Range   Glucose-Capillary 210 (*) 70 - 99 mg/dL   Dg Abd 1 View  08/31/2013   CLINICAL DATA:  Feeding tube placement.  EXAM: ABDOMEN - 1 VIEW  COMPARISON:  Chest in two views abdomen 08/30/2013.  FINDINGS: Single fluoroscopic spot view of the upper abdomen demonstrates an NG tube is in place with the tip projecting in the distal stomach.  IMPRESSION: Feeding tube in good position.   Electronically Signed   By: Inge Rise M.D.   On: 08/31/2013 10:16   Dg Chest Port 1 View  08/31/2013   CLINICAL DATA:  Cough and shortness of breath  EXAM: PORTABLE CHEST - 1 VIEW  COMPARISON:  08/30/2013  FINDINGS: Cardiac shadow is stable. Mitral calcifications are again noted. Diffuse interstitial changes are seen in both lungs. Some clearing in the right upper lobe is noted. No new focal infiltrate is seen. No bony abnormality is noted.  IMPRESSION: Interval mild clearing in the right apex. No new focal abnormality is seen.   Electronically Signed   By: Inez Catalina M.D.   On: 08/31/2013 08:17   Dg Abd Acute W/chest  08/30/2013   CLINICAL DATA:  Vomiting  EXAM: ACUTE ABDOMEN SERIES (ABDOMEN 2 VIEW & CHEST 1 VIEW)  COMPARISON:  03/24/2013  FINDINGS: There is opacity at the medial right apex adjacent to the trachea. Hazy airspace disease in the remainder of the right upper lobe. Minimal linear atelectasis at the left base. Normal heart size. No pneumothorax.  There is no free intraperitoneal gas. Stabilization hardware in the right femur. Bone cement in L3 and L4. Osteopenia. No disproportionate dilatation of bowel.  IMPRESSION: Nonobstructive bowel gas pattern.  Right apical pulmonary opacity as described. This may represent a airspace disease. Follow-up studies until resolution are recommended.   Electronically Signed   By:  Maryclare Bean M.D.   On: 08/30/2013 13:17   Dg Addison Bailey G Tube Plc W/fl-no Rad  08/31/2013   CLINICAL DATA: continued vomiting; asp pna; prog GI bleed per Dr Verlon Au   NASO G TUBE PLACEMENT WITH FLUORO  Fluoroscopy was utilized by the requesting physician.  No radiographic  interpretation.  Review of Systems  Unable to perform ROS  Blood pressure 137/52, pulse 94, temperature 99.3 F (37.4 C), temperature source Axillary, resp. rate 23, height 5' (1.524 m), weight 38.5 kg (84 lb 14 oz), SpO2 99.00%. Physical Exam  Constitutional: Vital signs are normal. She appears cachectic. She is sleeping.  HENT:  Head: Normocephalic and atraumatic.  Cardiovascular: Normal rate and regular rhythm.   Respiratory: Effort normal and breath sounds normal.  GI: Soft. Bowel sounds are normal. She exhibits no distension and no mass. There is no tenderness. There is no rebound and no guarding.  Skin: Skin is warm and dry.   Assessment/Plan: ?Nausea, vomiting, coffee ground emesis with drop in hemoglobin-elevated BUN; As per my discussion with patient's daughter, she desires her mother to be comfort care only. She does NOT want her to have a barium swallow, EGD or any other procedures done.  Will sign off. Please call if further assistance is needed.     MANN,JYOTHI 08/31/2013, 7:09 PM

## 2013-08-31 NOTE — Progress Notes (Signed)
Patient has persistent cough and has difficulty swallowing. Awaiting bedside swallow evaluation tomorrow.

## 2013-08-31 NOTE — Progress Notes (Signed)
INITIAL NUTRITION ASSESSMENT  DOCUMENTATION CODES Per approved criteria  -Underweight   INTERVENTION:  Diet clarification pending, RD to add supplements accordingly RD to follow for nutrition care plan  NUTRITION DIAGNOSIS: Predicted suboptimal intake related to altered GI function as evidenced by chart review   Goal: Pt to meet >/= 90% of their estimated nutrition needs   Monitor:  PO & supplemental intake, weight, labs, I/O's  Reason for Assessment: Malnutrition Screening Tool Report  80 y.o. female  Admitting Dx: Nausea & vomiting  ASSESSMENT: 80 y.o. Female with PMH of dementia, retinopathy, HTN, hip surgery; admitted from home with reported multiple episodes of nausea and vomiting.  RD unable to obtain nutrition hx at this time; pt not answering questions appropriately; shaking in bed; NGT to LIS; per wt readings, pt's weight down since March 2015 (11%) -- severe for time frame; Clear Liquid tray in patient's room untouched -- RN to clarify diet order.  RD unable to complete Nutrition Focused Physical Exam at this time.  Height: Ht Readings from Last 1 Encounters:  08/30/13 5' (1.524 m)    Weight: Wt Readings from Last 1 Encounters:  08/31/13 84 lb 14 oz (38.5 kg)    Ideal Body Weight: 100 lb  % Ideal Body Weight: 84%  Wt Readings from Last 10 Encounters:  08/31/13 84 lb 14 oz (38.5 kg)  08/28/13 100 lb (45.36 kg)  05/21/13 95 lb (43.092 kg)  03/25/13 115 lb (52.164 kg)  03/25/13 115 lb (52.164 kg)  02/05/13 95 lb (43.092 kg)  02/05/13 95 lb (43.092 kg)  11/14/12 110 lb 3.2 oz (49.986 kg)  08/18/12 122 lb (55.339 kg)    Usual Body Weight: 95 lb -- March 2015  % Usual Body Weight: 88%  BMI:  Body mass index is 16.58 kg/(m^2).  Estimated Nutritional Needs: Kcal: 1200-1400 Protein: 50-60 gm Fluid: >/= 1.5 L  Skin: Intact  Diet Order: Clear Liquid  EDUCATION NEEDS: -No education needs identified at this time   Intake/Output Summary (Last  24 hours) at 08/31/13 1447 Last data filed at 08/31/13 1200  Gross per 24 hour  Intake   1730 ml  Output      0 ml  Net   1730 ml    Labs:   Recent Labs Lab 08/30/13 2028 08/31/13 0245 08/31/13 0830  NA 150* 150* 147  K 3.9 4.0 3.7  CL 99 97 96  CO2 40* 39* 37*  BUN 34* 40* 43*  CREATININE 1.20* 1.64* 1.59*  CALCIUM 9.4 8.9 8.9  GLUCOSE 244* 136* 203*    CBG (last 3)   Recent Labs  08/31/13 0513 08/31/13 1016 08/31/13 1204  GLUCAP 163* 182* 189*    Scheduled Meds: . heparin  5,000 Units Subcutaneous 3 times per day  . heparin  5,000 Units Subcutaneous 3 times per day  . insulin aspart  0-9 Units Subcutaneous 6 times per day  . insulin detemir  10 Units Subcutaneous QHS  . pantoprazole (PROTONIX) IV  40 mg Intravenous Q12H    Continuous Infusions: . sodium chloride Stopped (08/30/13 1907)  . dextrose 5 % and 0.45% NaCl 75 mL/hr at 08/30/13 1907    Past Medical History  Diagnosis Date  . Osteoporosis   . Diabetes mellitus without complication   . GERD (gastroesophageal reflux disease)   . Back pain   . Hypertension   . Arthritis   . Hepatitis     as a child  . Crohn's disease   . CHF (congestive  heart failure)   . Neuropathy   . History of recurrent UTIs   . Carpal tunnel syndrome   . Hyperlipidemia   . Dementia   . Stroke   . Vomiting     associated with anxiety    Past Surgical History  Procedure Laterality Date  . Knee surgery Right   . Elbow surgery Left   . Abdominal surgery    . Eye surgery      bilateral cataracts  . Colonoscopy    . Hardware removal Left 02/09/2013    Procedure: HARDWARE REMOVAL, IRRIGATION AND DEBRIDEMENT LEFT ELBOW;  Surgeon: Cheral AlmasNaiping Michael Xu, MD;  Location: [facility];  Service: Orthopedics;  Laterality: Left;  . Intramedullary (im) nail intertrochanteric Right 03/25/2013    Procedure: INTRAMEDULLARY (IM) NAIL RIGHT INTERTROCHANTRIC HIP FRACTURE;  Surgeon: Cheral AlmasNaiping Michael Xu, MD;  Location: [facility];  Service:  Orthopedics;  Laterality: Right;  . Open reduction internal fixation (orif) distal radial fracture Right 03/25/2013    Procedure: OPEN REDUCTION INTERNAL FIXATION (ORIF) RIGHT DISTAL RADIUS FRACTURE;  Surgeon: Cheral AlmasNaiping Michael Xu, MD;  Location: [facility];  Service: Orthopedics;  Laterality: Right;    Maureen ChattersKatie Lamberton, RD, LDN Pager #: [phone] After-Hours Pager #: [phone]

## 2013-08-31 NOTE — Progress Notes (Signed)
Note: This document was prepared with digital dictation and possible smart phrase technology. Any transcriptional errors that result from this process are unintentional.   Janet Frazier TLX:[id number] DOB: [birth year] DOA: 08/30/2013 PCP: Thressa Sheller, MD  Brief narrative: 80 y/o ?, known h/o mod-severe dementia [last mmse 18/30 on 05/21/13], IDDM + retinopathy, Htn,chronic LBP-Hip surgery 03/25/13,multiple thoracic #s admitted from home with reported multiple episodes N/V-no reported diarrhea. Her emesis was found to be dark but gastroccult not collected.   Because her sugar was elevated as well it was presumed her combined acidosis was 2/2 to DKA or starvation ketosis Found to have AG=22 ion admit along with metabolic alkalosis with concomitant probable respiratory component and lactic acid 3.5   Past medical history-As per Problem list Chart reviewed as below- reviewed  Consultants:  None yet  Procedures:  none  Antibiotics:  none   Subjective   Sleepy.  No CP but tender in mid epigastric region. Nursing reports some mild confusion with 3-4 episodes of dark vomit No stools yet. No CP per her report    Objective    Interim History:   Telemetry:    Objective: Filed Vitals:   08/31/13 0300 08/31/13 0400 08/31/13 0408 08/31/13 0601  BP: 109/46 128/55 137/56   Pulse:   92   Temp:   98.8 F (37.1 C)   TempSrc:   Axillary   Resp:   21   Height:      Weight:    38.5 kg (84 lb 14 oz)  SpO2:   100%     Intake/Output Summary (Last 24 hours) at 08/31/13 7416 Last data filed at 08/30/13 1907  Gross per 24 hour  Intake 463.75 ml  Output      0 ml  Net 463.75 ml    Exam:  General: eomi, ncat, no issues Cardiovascular: s1 s2 no m/r/g Respiratory: clear, no added sound Abdomen: soft but tender in epigastrium Skin no le edema Neuro intact.  Moves all 4 limbs =  Data Reviewed: Basic Metabolic Panel:  Recent Labs Lab 08/30/13 1450 08/30/13 1659  08/30/13 1822 08/30/13 2028 08/31/13 0245  NA 150* 150* 153* 150* 150*  K 3.2* 3.1* 3.5* 3.9 4.0  CL 96 96 97 99 97  CO2 37* 38* 42* 40* 39*  GLUCOSE 428* 318* 293* 244* 136*  BUN 29* 31* 33* 34* 40*  CREATININE 0.75 1.00 1.20* 1.20* 1.64*  CALCIUM 9.6 9.7 9.4 9.4 8.9   Liver Function Tests:  Recent Labs Lab 08/30/13 1045 08/30/13 1357 08/31/13 0245  AST _0 ALT _1 ALKPHOS 107 103 87  BILITOT 0.4 0.3 0.3  PROT 8.0 8.0 7.2  ALBUMIN 3.9 3.8 3.5   No results found for this basename: LIPASE, AMYLASE,  in the last 168 hours No results found for this basename: AMMONIA,  in the last 168 hours CBC:  Recent Labs Lab 08/30/13 1045 08/30/13 1450 08/31/13 0245  WBC 13.6* 14.5* 19.6*  NEUTROABS 12.1*  --   --   HGB 15.0 14.5 12.7  HCT 45.1 43.9 39.9  MCV 86.6 86.2 87.9  PLT 317 338 341   Cardiac Enzymes:  Recent Labs Lab 08/30/13 1045  TROPONINI <0.30   BNP: No components found with this basename: POCBNP,  CBG:  Recent Labs Lab 08/31/13 0058 08/31/13 0155 08/31/13 0308 08/31/13 0407 08/31/13 0513  GLUCAP 102* 91 143* 154* 163*    Recent Results (from the past 240 hour(s))  MRSA PCR SCREENING  Status: None   Collection Time    08/30/13  4:36 PM      Result Value Ref Range Status   MRSA by PCR NEGATIVE  NEGATIVE Final   Comment:            The GeneXpert MRSA Assay (FDA     approved for NASAL specimens     only), is one component of a     comprehensive MRSA colonization     surveillance program. It is not     intended to diagnose MRSA     infection nor to guide or     monitor treatment for     MRSA infections.     Studies:              All Imaging reviewed and is as per above notation   Scheduled Meds: . heparin  5,000 Units Subcutaneous 3 times per day  . heparin  5,000 Units Subcutaneous 3 times per day  . insulin glargine  22 Units Subcutaneous Daily   Continuous Infusions: . sodium chloride Stopped (08/30/13 1907)  .  dextrose 5 % and 0.45% NaCl 75 mL/hr at 08/30/13 1907  . insulin (NOVOLIN-R) infusion Stopped (08/31/13 0500)     Assessment/Plan:  1. potential UGIB as Abd discomfort, dark vomit and drop in Hb 15-->11-place on protonix IV q 12.  We will also consult [facility] for potential endoscopy 2. Mixed met alkalosis with resp acidosis-AG now 14.  Insulin Gtt turned off overnight.  Continue D5 water 75 cc/hr.  Rpt BMe to ensure resolution.  Urine chloride still is pending. 3. Acute kidney injury-elevated bun could represent UGIB-will formally consult nephrology for an opinion if renal insufficiency worsens over next 12-24 hrs. 4. Potential Aspiration PNA-will rpt 1 vw CXR given elevated WBC 13-->19.  Will place on coverage if confirms PNA 5. DM ty 2.  Given 22 U transitional Lantus,.  Q 4 ssi ordered.  Add lantus scheduled  10 U qpm 6. Mod-severe dementia MMSE 18/30.  Seroquel on hold 7. Hypokalemia-resolved 8. Prior hip fracture-get PT to see patient eventually-will probably needs SNF level care  Code Status: DNR Family Communication: Rivers,Carolyn Daughter [phone] called.  No answer.  Left VM Disposition Plan: SDU-consult GI, maybe nephrology-place G tube, get U chloride.     Verneita Griffes, MD  [facility] Pager [phone] 08/31/2013, 7:13 AM    LOS: 1 day

## 2013-08-31 NOTE — Progress Notes (Signed)
Patient's three daughters came at 7pm. They were distressed to see patient's quiver like mouth movements. Dr. Craige CottaKirby made aware with electrolyte replacement now on board. Family do not want pt to have a bed side swallow evaluation tomorrow. Their main concern is that their mom may be in pain . Pain meds requested and pallaitive consult placed.

## 2013-08-31 NOTE — ED Provider Notes (Signed)
CSN: [id number]     Arrival date & time 08/30/13  [id number] History   First MD Initiated Contact with Patient 08/30/13 1002     Chief Complaint  Patient presents with  . Emesis     (Consider location/radiation/quality/duration/timing/severity/associated sxs/prior Treatment) HPI Comments: Patient presents to the ER for evaluation of nausea, vomiting. Patient started vomiting 2 nights ago, had continuous emesis throughout the course of yesterday, unable to hold anything down. She is awake alert in very slight abdominal pain, however. She does have a history of Crohn's disease, family is concerned that this might be a Crohn's flare. Vomiting has been very dark, as has been her stool. She does not have any history of GI bleeding. There is no chest pain or shortness of breath.  Patient is a 80 y.o. female presenting with vomiting.  Emesis Associated symptoms: abdominal pain     Past Medical History  Diagnosis Date  . Osteoporosis   . Diabetes mellitus without complication   . GERD (gastroesophageal reflux disease)   . Back pain   . Hypertension   . Arthritis   . Hepatitis     as a child  . Crohn's disease   . CHF (congestive heart failure)   . Neuropathy   . History of recurrent UTIs   . Carpal tunnel syndrome   . Hyperlipidemia   . Dementia   . Stroke   . Vomiting     associated with anxiety   Past Surgical History  Procedure Laterality Date  . Knee surgery Right   . Elbow surgery Left   . Abdominal surgery    . Eye surgery      bilateral cataracts  . Colonoscopy    . Hardware removal Left 02/09/2013    Procedure: HARDWARE REMOVAL, IRRIGATION AND DEBRIDEMENT LEFT ELBOW;  Surgeon: Cheral AlmasNaiping Michael Xu, MD;  Location: [facility] OR;  Service: Orthopedics;  Laterality: Left;  . Intramedullary (im) nail intertrochanteric Right 03/25/2013    Procedure: INTRAMEDULLARY (IM) NAIL RIGHT INTERTROCHANTRIC HIP FRACTURE;  Surgeon: Cheral AlmasNaiping Michael Xu, MD;  Location: [facility] OR;  Service: Orthopedics;   Laterality: Right;  . Open reduction internal fixation (orif) distal radial fracture Right 03/25/2013    Procedure: OPEN REDUCTION INTERNAL FIXATION (ORIF) RIGHT DISTAL RADIUS FRACTURE;  Surgeon: Cheral AlmasNaiping Michael Xu, MD;  Location: [facility] OR;  Service: Orthopedics;  Laterality: Right;   Family History  Problem Relation Age of Onset  . Stroke Father   . Heart Problems Mother    History  Substance Use Topics  . Smoking status: Former Games developermoker  . Smokeless tobacco: Never Used     Comment: Quit in 1990  . Alcohol Use: No   OB History   Grav Para Term Preterm Abortions TAB SAB Ect Mult Living                 Review of Systems  Constitutional: Positive for fatigue. Negative for fever.  Respiratory: Negative for shortness of breath.   Cardiovascular: Negative for chest pain.  Gastrointestinal: Positive for nausea, vomiting and abdominal pain.  All other systems reviewed and are negative.     Allergies  Review of patient's allergies indicates no known allergies.  Home Medications   Prior to Admission medications   Medication Sig Start Date End Date Taking? Authorizing Provider  insulin glargine (LANTUS) 100 UNIT/ML injection Inject 0.22 mLs (22 Units total) into the skin daily before breakfast. 03/27/13  Yes Jessica U Vann, DO  OxyCODONE HCl, Abuse Deter, 5 MG TABA Take 5  mg by mouth daily as needed (PAIN).   Yes Historical Provider, MD  QUEtiapine (SEROQUEL) 25 MG tablet 1/2 to one tab po qhs 06/23/13  Yes Levert FeinsteinYijun Yan, MD   BP 137/52  Pulse 94  Temp(Src) 99.3 F (37.4 C) (Axillary)  Resp 23  Ht 5' (1.524 m)  Wt 84 lb 14 oz (38.5 kg)  BMI 16.58 kg/m2  SpO2 99% Physical Exam  Constitutional: She is oriented to person, place, and time. She appears well-developed and well-nourished. No distress.  HENT:  Head: Normocephalic and atraumatic.  Right Ear: Hearing normal.  Left Ear: Hearing normal.  Nose: Nose normal.  Mouth/Throat: Oropharynx is clear and moist and mucous membranes are  normal.  Eyes: Conjunctivae and EOM are normal. Pupils are equal, round, and reactive to light.  Neck: Normal range of motion. Neck supple.  Cardiovascular: Regular rhythm, S1 normal and S2 normal.  Exam reveals no gallop and no friction rub.   No murmur heard. Pulmonary/Chest: Effort normal and breath sounds normal. No respiratory distress. She exhibits no tenderness.  Abdominal: Soft. Normal appearance and bowel sounds are normal. There is no hepatosplenomegaly. There is no tenderness. There is no rebound, no guarding, no tenderness at McBurney's point and negative Murphy's sign. No hernia.  Musculoskeletal: Normal range of motion.  Neurological: She is alert and oriented to person, place, and time. She has normal strength. No cranial nerve deficit or sensory deficit. Coordination normal. GCS eye subscore is 4. GCS verbal subscore is 5. GCS motor subscore is 6.  Skin: Skin is warm, dry and intact. No rash noted. No cyanosis.  Psychiatric: She has a normal mood and affect. Her speech is normal and behavior is normal. Thought content normal.    ED Course  Procedures (including critical care time) Labs Review Labs Reviewed  CBC WITH DIFFERENTIAL - Abnormal; Notable for the following:    WBC 13.6 (*)    RBC 5.21 (*)    Neutrophils Relative % 89 (*)    Neutro Abs 12.1 (*)    Lymphocytes Relative 5 (*)    Lymphs Abs 0.6 (*)    All other components within normal limits  COMPREHENSIVE METABOLIC PANEL - Abnormal; Notable for the following:    Sodium 150 (*)    Potassium 3.3 (*)    Chloride 93 (*)    CO2 35 (*)    Glucose, Bld 473 (*)    BUN 24 (*)    GFR calc non Af Amer 88 (*)    All other components within normal limits  URINALYSIS, ROUTINE W REFLEX MICROSCOPIC - Abnormal; Notable for the following:    APPearance TURBID (*)    Glucose, UA >1000 (*)    Hgb urine dipstick TRACE (*)    Ketones, ur >80 (*)    Protein, ur 100 (*)    All other components within normal limits  URINE  MICROSCOPIC-ADD ON - Abnormal; Notable for the following:    Bacteria, UA MANY (*)    All other components within normal limits  COMPREHENSIVE METABOLIC PANEL - Abnormal; Notable for the following:    Sodium 150 (*)    Potassium 3.3 (*)    Chloride 93 (*)    CO2 36 (*)    Glucose, Bld 486 (*)    BUN 28 (*)    GFR calc non Af Amer 80 (*)    All other components within normal limits  LACTIC ACID, PLASMA - Abnormal; Notable for the following:    Lactic Acid,  Venous 3.5 (*)    All other components within normal limits  BASIC METABOLIC PANEL - Abnormal; Notable for the following:    Sodium 150 (*)    Potassium 3.2 (*)    CO2 37 (*)    Glucose, Bld 428 (*)    BUN 29 (*)    GFR calc non Af Amer 78 (*)    All other components within normal limits  BASIC METABOLIC PANEL - Abnormal; Notable for the following:    Sodium 150 (*)    Potassium 3.1 (*)    CO2 38 (*)    Glucose, Bld 318 (*)    BUN 31 (*)    GFR calc non Af Amer 52 (*)    GFR calc Af Amer 60 (*)    All other components within normal limits  BASIC METABOLIC PANEL - Abnormal; Notable for the following:    Sodium 153 (*)    Potassium 3.5 (*)    CO2 42 (*)    Glucose, Bld 293 (*)    BUN 33 (*)    Creatinine, Ser 1.20 (*)    GFR calc non Af Amer 42 (*)    GFR calc Af Amer 48 (*)    All other components within normal limits  BASIC METABOLIC PANEL - Abnormal; Notable for the following:    Sodium 150 (*)    CO2 40 (*)    Glucose, Bld 244 (*)    BUN 34 (*)    Creatinine, Ser 1.20 (*)    GFR calc non Af Amer 42 (*)    GFR calc Af Amer 48 (*)    All other components within normal limits  CBC - Abnormal; Notable for the following:    WBC 14.5 (*)    All other components within normal limits  OCCULT BLOOD GASTRIC / DUODENUM (SPECIMEN CUP) - Abnormal; Notable for the following:    Occult Blood, Gastric POSITIVE (*)    All other components within normal limits  CBC - Abnormal; Notable for the following:    WBC 19.6 (*)     All other components within normal limits  COMPREHENSIVE METABOLIC PANEL - Abnormal; Notable for the following:    Sodium 150 (*)    CO2 39 (*)    Glucose, Bld 136 (*)    BUN 40 (*)    Creatinine, Ser 1.64 (*)    GFR calc non Af Amer 28 (*)    GFR calc Af Amer 33 (*)    All other components within normal limits  GLUCOSE, CAPILLARY - Abnormal; Notable for the following:    Glucose-Capillary 246 (*)    All other components within normal limits  GLUCOSE, CAPILLARY - Abnormal; Notable for the following:    Glucose-Capillary 241 (*)    All other components within normal limits  GLUCOSE, CAPILLARY - Abnormal; Notable for the following:    Glucose-Capillary 243 (*)    All other components within normal limits  GLUCOSE, CAPILLARY - Abnormal; Notable for the following:    Glucose-Capillary 216 (*)    All other components within normal limits  GLUCOSE, CAPILLARY - Abnormal; Notable for the following:    Glucose-Capillary 172 (*)    All other components within normal limits  GLUCOSE, CAPILLARY - Abnormal; Notable for the following:    Glucose-Capillary 148 (*)    All other components within normal limits  GLUCOSE, CAPILLARY - Abnormal; Notable for the following:    Glucose-Capillary 116 (*)    All other components  within normal limits  GLUCOSE, CAPILLARY - Abnormal; Notable for the following:    Glucose-Capillary 102 (*)    All other components within normal limits  GLUCOSE, CAPILLARY - Abnormal; Notable for the following:    Glucose-Capillary 143 (*)    All other components within normal limits  GLUCOSE, CAPILLARY - Abnormal; Notable for the following:    Glucose-Capillary 154 (*)    All other components within normal limits  GLUCOSE, CAPILLARY - Abnormal; Notable for the following:    Glucose-Capillary 163 (*)    All other components within normal limits  BASIC METABOLIC PANEL - Abnormal; Notable for the following:    CO2 37 (*)    Glucose, Bld 203 (*)    BUN 43 (*)     Creatinine, Ser 1.59 (*)    GFR calc non Af Amer 30 (*)    GFR calc Af Amer 34 (*)    All other components within normal limits  GLUCOSE, CAPILLARY - Abnormal; Notable for the following:    Glucose-Capillary 182 (*)    All other components within normal limits  GLUCOSE, CAPILLARY - Abnormal; Notable for the following:    Glucose-Capillary 189 (*)    All other components within normal limits  GLUCOSE, CAPILLARY - Abnormal; Notable for the following:    Glucose-Capillary 210 (*)    All other components within normal limits  CBG MONITORING, ED - Abnormal; Notable for the following:    Glucose-Capillary 440 (*)    All other components within normal limits  I-STAT ARTERIAL BLOOD GAS, ED - Abnormal; Notable for the following:    pH, Arterial 7.621 (*)    pO2, Arterial 43.0 (*)    Bicarbonate 43.0 (*)    Acid-Base Excess 19.0 (*)    All other components within normal limits  MRSA PCR SCREENING  TROPONIN I  PROTIME-INR  CHLORIDE, URINE, RANDOM  GLUCOSE, CAPILLARY  BASIC METABOLIC PANEL  POC OCCULT BLOOD, ED  POCT GASTRIC OCCULT BLOOD (1-CARD TO LAB)  TYPE AND SCREEN    Imaging Review Dg Abd 1 View  08/31/2013   CLINICAL DATA:  Feeding tube placement.  EXAM: ABDOMEN - 1 VIEW  COMPARISON:  Chest in two views abdomen 08/30/2013.  FINDINGS: Single fluoroscopic spot view of the upper abdomen demonstrates an NG tube is in place with the tip projecting in the distal stomach.  IMPRESSION: Feeding tube in good position.   Electronically Signed   By: Drusilla Kanner M.D.   On: 08/31/2013 10:16   Dg Chest Port 1 View  08/31/2013   CLINICAL DATA:  Cough and shortness of breath  EXAM: PORTABLE CHEST - 1 VIEW  COMPARISON:  08/30/2013  FINDINGS: Cardiac shadow is stable. Mitral calcifications are again noted. Diffuse interstitial changes are seen in both lungs. Some clearing in the right upper lobe is noted. No new focal infiltrate is seen. No bony abnormality is noted.  IMPRESSION: Interval mild  clearing in the right apex. No new focal abnormality is seen.   Electronically Signed   By: Alcide Clever M.D.   On: 08/31/2013 08:17   Dg Abd Acute W/chest  08/30/2013   CLINICAL DATA:  Vomiting  EXAM: ACUTE ABDOMEN SERIES (ABDOMEN 2 VIEW & CHEST 1 VIEW)  COMPARISON:  03/24/2013  FINDINGS: There is opacity at the medial right apex adjacent to the trachea. Hazy airspace disease in the remainder of the right upper lobe. Minimal linear atelectasis at the left base. Normal heart size. No pneumothorax.  There is no  free intraperitoneal gas. Stabilization hardware in the right femur. Bone cement in L3 and L4. Osteopenia. No disproportionate dilatation of bowel.  IMPRESSION: Nonobstructive bowel gas pattern.  Right apical pulmonary opacity as described. This may represent a airspace disease. Follow-up studies until resolution are recommended.   Electronically Signed   By: Maryclare Bean M.D.   On: 08/30/2013 13:17   Dg Vangie Bicker G Tube Plc W/fl-no Rad  08/31/2013   CLINICAL DATA: continued vomiting; asp pna; prog GI bleed per Dr Mahala Menghini   NASO G TUBE PLACEMENT WITH FLUORO  Fluoroscopy was utilized by the requesting physician.  No radiographic  interpretation.      EKG Interpretation   Date/Time:  Sunday August 30 2013 10:26:27 EDT Ventricular Rate:  108 PR Interval:  143 QRS Duration: 82 QT Interval:  384 QTC Calculation: 515 R Axis:   96 Text Interpretation:  Sinus tachycardia Ventricular premature complex  Probable left atrial enlargement Right axis deviation Consider left  ventricular hypertrophy Repol abnrm, severe global ischemia (LM/MVD)  Prolonged QT interval new inferior-lateral ST depressions since Jan 2015  Confirmed by [facility]  MD, CHRISTOPHER [phone]) on 08/30/2013 10:53:29 AM      MDM   Final diagnoses:  None   pneumonia, community-acquired versus aspiration  Vomiting  History of Crohn's disease  Hyponatremia  Patient does not appear to be in any acute distress at this time. There was  concern over the possibility of a GI bleeding based on the history of black vomit and stool. Her guaiac was negative in the ER, however. Gastrocult pending. The patient has a benign abdominal exam, no tenderness on examination and certainly nothing to suggest a surgical abdomen.  The patient is found to be profoundly hypernatremic. Etiology is unclear at this time. Patient likely has some profound dehydration secondary to vomiting. Cause of the vomiting is unclear. She does have evidence of pneumonia on x-ray, community acquired pneumonia versus aspiration.  Patient did have grossly abnormal EKG. This was new since January of this past year. She has diffuse, however mostly inferior and lateral, ST depressions. No ongoing chest pain at this time. Troponin negative. We'll need to cycle enzymes.    Gilda Crease, MD 08/31/13 [phone]

## 2013-09-01 DIAGNOSIS — R0600 Dyspnea, unspecified: Secondary | ICD-10-CM

## 2013-09-01 DIAGNOSIS — R451 Restlessness and agitation: Secondary | ICD-10-CM

## 2013-09-01 DIAGNOSIS — Z515 Encounter for palliative care: Secondary | ICD-10-CM

## 2013-09-01 DIAGNOSIS — IMO0002 Reserved for concepts with insufficient information to code with codable children: Secondary | ICD-10-CM

## 2013-09-01 DIAGNOSIS — R0609 Other forms of dyspnea: Secondary | ICD-10-CM

## 2013-09-01 DIAGNOSIS — R0989 Other specified symptoms and signs involving the circulatory and respiratory systems: Secondary | ICD-10-CM

## 2013-09-01 LAB — CBC WITH DIFFERENTIAL/PLATELET
BASOS ABS: 0 10*3/uL (ref 0.0–0.1)
Basophils Relative: 0 % (ref 0–1)
EOS PCT: 0 % (ref 0–5)
Eosinophils Absolute: 0 10*3/uL (ref 0.0–0.7)
HCT: 37.6 % (ref 36.0–46.0)
Hemoglobin: 12.1 g/dL (ref 12.0–15.0)
LYMPHS PCT: 6 % — AB (ref 12–46)
Lymphs Abs: 1.2 10*3/uL (ref 0.7–4.0)
MCH: 28.8 pg (ref 26.0–34.0)
MCHC: 32.2 g/dL (ref 30.0–36.0)
MCV: 89.5 fL (ref 78.0–100.0)
Monocytes Absolute: 1.7 10*3/uL — ABNORMAL HIGH (ref 0.1–1.0)
Monocytes Relative: 9 % (ref 3–12)
Neutro Abs: 16 10*3/uL — ABNORMAL HIGH (ref 1.7–7.7)
Neutrophils Relative %: 85 % — ABNORMAL HIGH (ref 43–77)
PLATELETS: 247 10*3/uL (ref 150–400)
RBC: 4.2 MIL/uL (ref 3.87–5.11)
RDW: 13.3 % (ref 11.5–15.5)
WBC: 18.9 10*3/uL — AB (ref 4.0–10.5)

## 2013-09-01 LAB — BASIC METABOLIC PANEL
BUN: 34 mg/dL — ABNORMAL HIGH (ref 6–23)
CALCIUM: 8.6 mg/dL (ref 8.4–10.5)
CO2: 36 meq/L — AB (ref 19–32)
Chloride: 103 mEq/L (ref 96–112)
Creatinine, Ser: 0.79 mg/dL (ref 0.50–1.10)
GFR calc Af Amer: 89 mL/min — ABNORMAL LOW (ref >90)
GFR, EST NON AFRICAN AMERICAN: 77 mL/min — AB (ref >90)
Glucose, Bld: 132 mg/dL — ABNORMAL HIGH (ref 70–99)
Potassium: 3.8 mEq/L (ref 3.7–5.3)
SODIUM: 150 meq/L — AB (ref 137–147)

## 2013-09-01 LAB — GLUCOSE, CAPILLARY
GLUCOSE-CAPILLARY: 134 mg/dL — AB (ref 70–99)
Glucose-Capillary: 186 mg/dL — ABNORMAL HIGH (ref 70–99)
Glucose-Capillary: 142 mg/dL — ABNORMAL HIGH (ref 70–99)

## 2013-09-01 MED ORDER — DILTIAZEM HCL 25 MG/5ML IV SOLN
5.0000 mg | Freq: Once | INTRAVENOUS | Status: AC
  Administered 2013-09-01: 5 mg via INTRAVENOUS
  Filled 2013-09-01: qty 5

## 2013-09-01 MED ORDER — LORAZEPAM 2 MG/ML IJ SOLN
1.0000 mg | INTRAMUSCULAR | Status: DC | PRN
  Administered 2013-09-01 – 2013-09-02 (×2): 1 mg via INTRAVENOUS
  Filled 2013-09-01 (×2): qty 1

## 2013-09-01 MED ORDER — OXYCODONE HCL 5 MG PO TABS: 5.0000 mg | ORAL_TABLET | Freq: Every day | ORAL | Status: DC | PRN

## 2013-09-01 MED ORDER — QUETIAPINE FUMARATE 25 MG PO TABS
25.0000 mg | ORAL_TABLET | Freq: Every day | ORAL | Status: DC
  Filled 2013-09-01: qty 1

## 2013-09-01 MED ORDER — DIGOXIN 0.25 MG/ML IJ SOLN
0.1250 mg | Freq: Four times a day (QID) | INTRAMUSCULAR | Status: AC
  Administered 2013-09-01 (×2): 0.125 mg via INTRAVENOUS
  Filled 2013-09-01 (×3): qty 0.5

## 2013-09-01 MED ORDER — POTASSIUM CHLORIDE 10 MEQ/100ML IV SOLN
10.0000 meq | INTRAVENOUS | Status: AC
  Administered 2013-09-01 (×4): 10 meq via INTRAVENOUS
  Filled 2013-09-01 (×4): qty 100

## 2013-09-01 MED ORDER — MORPHINE SULFATE 2 MG/ML IJ SOLN
1.0000 mg | INTRAMUSCULAR | Status: DC | PRN
  Administered 2013-09-02: 1 mg via INTRAVENOUS
  Filled 2013-09-01: qty 1

## 2013-09-01 MED ORDER — DILTIAZEM HCL 100 MG IV SOLR
2.5000 mg/h | INTRAVENOUS | Status: DC
  Administered 2013-09-01: 5 mg/h via INTRAVENOUS
  Filled 2013-09-01: qty 100

## 2013-09-01 NOTE — Progress Notes (Signed)
Pts. Heart rate went into the 150's. Was reading Afib on the monitor. An EKG was done and I paged the hospitalist NP. NP Craige CottaKirby ordered 5mg  of Cardizem. Was given at 0136.

## 2013-09-01 NOTE — Progress Notes (Signed)
Speech Language Pathology    Patient Details Name: Janet Frazier MRN: [id number] DOB: 06/12/1930 Today's Date: 09/01/2013 Time: 0820-     RN note states family does not want bedside swallow assessment performed.  MD aware and will see pt. Later today.  SLP will hold off for now.    Breck CoonsLisa Willis DanaLitaker M.Ed [employer] Pager [phone]  09/01/2013

## 2013-09-01 NOTE — Progress Notes (Addendum)
RN paged earlier secondary to pt converting to Afib with RVR confirmed with 12 lead EKG. Cardizem 5mg  IV push x 1. NP to unit at 0200. HR with Afib in the 130-140s on tele. BP 90-100 systolic. BP can not withhold further Cardizem or Metoprolol at this time. Will give Digoxin and watch pt for now. Would like to not have to use amiodarone in this elderly lady. Will continue to watch. If BP elevates, can start Cardizem gtt.  Jimmye NormanKaren Kirby-Graham, NP [facility] Update: BP holding now. Will start Cardizem drip 2.5-15mcg titrated for goal HR of 90-110. Already had a bolus.  KJKG, NP

## 2013-09-01 NOTE — Progress Notes (Signed)
Note: This document was prepared with digital dictation and possible smart phrase technology. Any transcriptional errors that result from this process are unintentional.   Janet Frazier QZE:[id number] DOB: June 29, 1930 DOA: 08/30/2013 PCP: Thressa Sheller, MD  Brief narrative: 80 y/o ?, known h/o mod-severe dementia [last mmse 18/30 on 05/21/13], IDDM + retinopathy, Htn,chronic LBP-Hip surgery 03/25/13,multiple thoracic #s admitted from home with reported multiple episodes N/V-no reported diarrhea. Her emesis was found to be dark but gastroccult not collected.   Because her sugar was elevated as well it was presumed her combined acidosis was 2/2 to DKA or starvation ketosis Found to have AG=22 in admit along with metabolic alkalosis with concomitant probable respiratory component and lactic acid 3.5 without source   Past medical history-As per Problem list Chart reviewed as below- reviewed  Consultants:  None yet  Procedures:  none  Antibiotics:  none   Subjective   More alert.  Looking a little better Went ino Afib c RVr and then converted this am to nsr No fever nor chills No CP NG tube out ~100 cc Passed stool yesterday-unclear if was dark or not     Objective    Interim History:   Telemetry:    Objective: Filed Vitals:   09/01/13 0400 09/01/13 0500 09/01/13 0700 09/01/13 0800  BP: 117/90 126/82 147/74 124/64  Pulse: 63 60 94 84  Temp: 98.2 F (36.8 C)   98.4 F (36.9 C)  TempSrc: Axillary   Axillary  Resp: $Remo'26 25 20 23  'qVMxx$ Height:      Weight:      SpO2: 100% 100% 95% 97%    Intake/Output Summary (Last 24 hours) at 09/01/13 1002 Last data filed at 09/01/13 0600  Gross per 24 hour  Intake 1975.17 ml  Output    450 ml  Net 1525.17 ml    Exam:  General: eomi, ncat, no issues Cardiovascular: s1 s2 no m/r/g Respiratory: clear, no added sound Abdomen: soft but tender in epigastrium Skin no le edema Neuro intact.  Moves all 4 limbs =  Data  Reviewed: Basic Metabolic Panel:  Recent Labs Lab 08/30/13 1822 08/30/13 2028 08/31/13 0245 08/31/13 0830 08/31/13 1942  NA 153* 150* 150* 147 148*  K 3.5* 3.9 4.0 3.7 3.3*  CL 97 99 97 96 98  CO2 42* 40* 39* 37* 38*  GLUCOSE 293* 244* 136* 203* 235*  BUN 33* 34* 40* 43* 47*  CREATININE 1.20* 1.20* 1.64* 1.59* 1.26*  CALCIUM 9.4 9.4 8.9 8.9 8.7  MG  --   --   --   --  1.7   Liver Function Tests:  Recent Labs Lab 08/30/13 1045 08/30/13 1357 08/31/13 0245  AST $Re'12 11 19  'OOY$ ALT $R'10 9 8  'nT$ ALKPHOS 107 103 87  BILITOT 0.4 0.3 0.3  PROT 8.0 8.0 7.2  ALBUMIN 3.9 3.8 3.5   No results found for this basename: LIPASE, AMYLASE,  in the last 168 hours No results found for this basename: AMMONIA,  in the last 168 hours CBC:  Recent Labs Lab 08/30/13 1045 08/30/13 1450 08/31/13 0245 09/01/13 0405  WBC 13.6* 14.5* 19.6* 18.9*  NEUTROABS 12.1*  --   --  16.0*  HGB 15.0 14.5 12.7 12.1  HCT 45.1 43.9 39.9 37.6  MCV 86.6 86.2 87.9 89.5  PLT 317 338 341 247   Cardiac Enzymes:  Recent Labs Lab 08/30/13 1045  TROPONINI <0.30   BNP: No components found with this basename: POCBNP,  CBG:  Recent Labs Lab  08/31/13 1604 08/31/13 2038 09/01/13 0051 09/01/13 0444 09/01/13 0825  GLUCAP 210* 217* 186* 142* 134*    Recent Results (from the past 240 hour(s))  MRSA PCR SCREENING     Status: None   Collection Time    08/30/13  4:36 PM      Result Value Ref Range Status   MRSA by PCR NEGATIVE  NEGATIVE Final   Comment:            The GeneXpert MRSA Assay (FDA     approved for NASAL specimens     only), is one component of a     comprehensive MRSA colonization     surveillance program. It is not     intended to diagnose MRSA     infection nor to guide or     monitor treatment for     MRSA infections.     Studies:              All Imaging reviewed and is as per above notation   Scheduled Meds: . heparin  5,000 Units Subcutaneous 3 times per day  . heparin  5,000  Units Subcutaneous 3 times per day  . insulin aspart  0-9 Units Subcutaneous 6 times per day  . insulin detemir  10 Units Subcutaneous QHS  . pantoprazole (PROTONIX) IV  40 mg Intravenous Q12H   Continuous Infusions: . sodium chloride Stopped (08/30/13 1907)  . dextrose 5 % and 0.45% NaCl 75 mL/hr at 08/31/13 2100     Assessment/Plan:  1. A fib c RVR-occurred overngiht-converted to NSR.  Probably due to stress of bleed/electrolyte shift.  Get magnesium in am and also monitor overnight on sdu.  D/c digoxin and cardizem started and monitor today on 2600 2. potential UGIB as Abd discomfort, dark vomit and drop in Hb 15-->11-place on protonix IV q 12.  GI and family to discuss r/b/a to endoscopy/colonoscopy 3. Mixed met alkalosis with resp acidosis-AG now 11.  Insulin Gtt turned off 6/29.  Continue D5 water 75 cc/hr.  Urine chloride 29 indicating saline -resistant but this was probably incorrect as this was taken when patient was being hydrated. 4. Acute kidney injury-elevated bun multifactorial-resolving with saine 5. Potential Aspiration PNA-will rpt 1 vw CXR given elevated WBC 13-->19.  CXR doesn't;t confirm PNA-hold abx 6. DM ty 2.  Given 22 U transitional Lantus,.  Q 4 ssi ordered.  Add lantus scheduled  10 U qpm 7. Mod-severe dementia MMSE 18/30.  Seroquel on hold 8. Hypokalemia-resolved 9. Prior hip fracture-get PT to see patient eventually-will probably needs SNF level care  Code Status: DNR Family Communication:long discussion with all 3 daughters.  Wants palliative input.  GI to re-eval Disposition Plan: SDU overnight   Verneita Griffes, MD  [facility] Pager [phone] 09/01/2013, 10:02 AM    LOS: 2 days

## 2013-09-01 NOTE — Clinical Documentation Improvement (Signed)
Clinical Documentation Clarification #1:   Possible Clinical Conditions?  Septicemia / Sepsis Severe Sepsis Neutropenic Sepsis  SIRS Septic Shock Sepsis with UTI Sepsis due to an internal device  Bacterial infection of unknown etiology / source Other Condition  Cannot clinically Determine    Labs: 6/28:  Lactic acidosis:  3.5. ________________________________________________________________________________________________  Clinical Documentation Clarification #2:   Possible Clinical Conditions?   UTI Other Condition Cannot Clinically Determine   Risk Factors: Her urine always seems to be foul.  Labs: 6/28:  Abnormal urinalysis and abnormal urine micro.   Thank You, Marciano SequinWanda Mathews-Bethea,RN,BSN, Clinical Documentation Specialist:  [phone]  [facility]- Health Information Management

## 2013-09-01 NOTE — Consult Note (Signed)
Patient [id number] Janet Frazier      DOB: 11-Jun-1930      UJW:[id number]     Consult Note from the Palliative Medicine Team at [facility]   Consult Requested by: Dr Mahala Menghini     PCP: Thayer Headings, MD Reason for Consultation:Clarification of GOC and options     Phone [phone]  Assessment of patients Current state:  Continued physical, functional and cognitive decline over the past year and significantly since hip fx in January 2015.  Weight loss of 40-50 pounds in 9 months, frequent falls, worsening cognition.  Family faced with advanced directive decisions and anticipatory care needs.   This NP Lorinda Creed reviewed medical records, received report from team, assessed the patient and then meet at the patient's bedside along with her three daughters, [place], Byrd Hesselbach to discuss diagnosis prognosis, GOC, EOL wishes disposition and options.  A detailed discussion was had today regarding advanced directives.  Concepts specific to code status, artifical feeding and hydration, continued IV antibiotics and rehospitalization was had.  The difference between a aggressive medical intervention path  and a palliative comfort care path for this patient at this time was had.  Values and goals of care important to patient and family were attempted to be elicited.  Concept of Hospice and Palliative Care were discussed  Natural trajectory and expectations at EOL were discussed.  Questions and concerns addressed.  Hard Choices booklet left for review. Family encouraged to call with questions or concerns.  PMT will continue to support holistically.   Goals of Care: 1.  Code Status: DNR/DNI   2. Scope of Treatment: 1. Vital Signs:daily  2. Respiratory/Oxygen:for comfort only 3. Nutritional Support/Tube Feeds: no artifical feeding now or in the future 4. Antibiotics: oral only if tolerated  5. Blood Products:none 6. IVF: KVO for meds 7. Review of Medications to be  discontinued:minimize fr comfort 8. Labs:none 9. Telemetry:none    3. Disposition:   -Family is aware of probable transfer to med-surg unit for comfort care (4N or 6N)    Re-eva lute in am for in-patient hospice facility   4. Symptom Management:   Focus is comfort, quality and dignity   1. Anxiety/Agitation:Ativan 1 mg IV every 4 hrs prn 2. Pain/Dyspnea: Morphine 1 mg IV every 1 hr prn  5. Psychosocial:  Emotional support offered to family.  All feel comfortable with decision for comfort knowing this "is what she would want for herself"     Patient Documents Completed or Given: Document Given Completed  Advanced Directives Pkt    MOST  yes  DNR    Gone from My Sight    Hard Choices yes     Brief HPI:   80 y/o female , PMH  mod-severe dementia, IDDM + retinopathy, Htn,chronic LBP-Hip surgery 03/25/13, multiple  admitted from home with reported multiple episodes N/V-no reported diarrhea. Appears patient was in ED a couple of days ago with some pain in knee and ultimately came to the ED. Was given K tablets which makes her sick. Hasn't eaten much as she usually does-noticed sudden episodes of vomiting 6/27 pm and continued to have this as well- Daughter noted vomit in the bed-this appeared to be dark in nature and this worried her.  Hospitalized for stabilization.     QIO:NGEXBM to illict   PMH:  Past Medical History  Diagnosis Date  . Osteoporosis   . Diabetes mellitus without complication   . GERD (gastroesophageal reflux disease)   . Back pain   .  Hypertension   . Arthritis   . Hepatitis     as a child  . Crohn's disease   . CHF (congestive heart failure)   . Neuropathy   . History of recurrent UTIs   . Carpal tunnel syndrome   . Hyperlipidemia   . Dementia   . Stroke   . Vomiting     associated with anxiety     PSH: Past Surgical History  Procedure Laterality Date  . Knee surgery Right   . Elbow surgery Left   . Abdominal surgery    . Eye surgery       bilateral cataracts  . Colonoscopy    . Hardware removal Left 02/09/2013    Procedure: HARDWARE REMOVAL, IRRIGATION AND DEBRIDEMENT LEFT ELBOW;  Surgeon: Cheral AlmasNaiping Michael Xu, MD;  Location: [facility] OR;  Service: Orthopedics;  Laterality: Left;  . Intramedullary (im) nail intertrochanteric Right 03/25/2013    Procedure: INTRAMEDULLARY (IM) NAIL RIGHT INTERTROCHANTRIC HIP FRACTURE;  Surgeon: Cheral AlmasNaiping Michael Xu, MD;  Location: [facility] OR;  Service: Orthopedics;  Laterality: Right;  . Open reduction internal fixation (orif) distal radial fracture Right 03/25/2013    Procedure: OPEN REDUCTION INTERNAL FIXATION (ORIF) RIGHT DISTAL RADIUS FRACTURE;  Surgeon: Cheral AlmasNaiping Michael Xu, MD;  Location: [facility] OR;  Service: Orthopedics;  Laterality: Right;   I have reviewed the FH and SH and  If appropriate update it with new information. No Known Allergies Scheduled Meds: . heparin  5,000 Units Subcutaneous 3 times per day  . heparin  5,000 Units Subcutaneous 3 times per day  . insulin aspart  0-9 Units Subcutaneous 6 times per day  . insulin detemir  10 Units Subcutaneous QHS  . pantoprazole (PROTONIX) IV  40 mg Intravenous Q12H   Continuous Infusions: . sodium chloride Stopped (08/30/13 1907)  . dextrose 5 % and 0.45% NaCl 75 mL/hr at 08/31/13 2100  . diltiazem (CARDIZEM) infusion 5 mg/hr (09/01/13 0558)   PRN Meds:.dextrose, morphine injection    BP 124/64  Pulse 84  Temp(Src) 98.4 F (36.9 C) (Axillary)  Resp 23  Ht 5' (1.524 m)  Wt 38.5 kg (84 lb 14 oz)  BMI 16.58 kg/m2  SpO2 97%   PPS:30 % at best   Intake/Output Summary (Last 24 hours) at 09/01/13 0934 Last data filed at 09/01/13 0600  Gross per 24 hour  Intake 1975.17 ml  Output    450 ml  Net 1525.17 ml    Physical Exam:  General: chronically ill appearing, NAD HEENT:  Dry buccal membranes, no exudate Chest:   diminished in bases  CTA CVS: RRR Abdomen:soft +BS Ext: without edema Neuro:  Weak and lethargic, unable to follow simple  commands presently   Labs: CBC    Component Value Date/Time   WBC 18.9* 09/01/2013 0405   RBC 4.20 09/01/2013 0405   HGB 12.1 09/01/2013 0405   HCT 37.6 09/01/2013 0405   PLT 247 09/01/2013 0405   MCV 89.5 09/01/2013 0405   MCH 28.8 09/01/2013 0405   MCHC 32.2 09/01/2013 0405   RDW 13.3 09/01/2013 0405   LYMPHSABS 1.2 09/01/2013 0405   MONOABS 1.7* 09/01/2013 0405   EOSABS 0.0 09/01/2013 0405   BASOSABS 0.0 09/01/2013 0405    BMET    Component Value Date/Time   NA 148* 08/31/2013 1942   K 3.3* 08/31/2013 1942   CL 98 08/31/2013 1942   CO2 38* 08/31/2013 1942   GLUCOSE 235* 08/31/2013 1942   BUN 47* 08/31/2013 1942   CREATININE 1.26* 08/31/2013  1942   CALCIUM 8.7 08/31/2013 1942   GFRNONAA 39* 08/31/2013 1942   GFRAA 45* 08/31/2013 1942    CMP     Component Value Date/Time   NA 148* 08/31/2013 1942   K 3.3* 08/31/2013 1942   CL 98 08/31/2013 1942   CO2 38* 08/31/2013 1942   GLUCOSE 235* 08/31/2013 1942   BUN 47* 08/31/2013 1942   CREATININE 1.26* 08/31/2013 1942   CALCIUM 8.7 08/31/2013 1942   PROT 7.2 08/31/2013 0245   ALBUMIN 3.5 08/31/2013 0245   AST 19 08/31/2013 0245   ALT 8 08/31/2013 0245   ALKPHOS 87 08/31/2013 0245   BILITOT 0.3 08/31/2013 0245   GFRNONAA 39* 08/31/2013 1942   GFRAA 45* 08/31/2013 1942     Time In Time Out Total Time Spent with Patient Total Overall Time  1000 1130 80 min 90 min    Greater than 50%  of this time was spent counseling and coordinating care related to the above assessment and plan.   Lorinda CreedMary Larach NP  Palliative Medicine Team Team Phone # [phone] Pager [phone]  Discussed with Dr Mahala MenghiniSamtani

## 2013-09-01 NOTE — Progress Notes (Signed)
Pt had a run of Kirby FunkVtach, MD notified.  Mikaela M. Derrell LollingIngram, RN, BSN 09/01/2013 11:00 AM

## 2013-09-02 DIAGNOSIS — E872 Acidosis, unspecified: Secondary | ICD-10-CM

## 2013-09-02 MED ORDER — LORAZEPAM 0.5 MG PO TABS
0.5000 mg | ORAL_TABLET | Freq: Three times a day (TID) | ORAL | Status: DC
  Administered 2013-09-02 – 2013-09-03 (×3): 0.5 mg via ORAL
  Filled 2013-09-02 (×4): qty 1

## 2013-09-02 NOTE — Progress Notes (Signed)
Progress Note from [facility] Pall[facility] Medicine Team at [facility]  Subjective:    -patient is oriented to person only, continued discussion with daughter Eber Jones(Carolyn) regarding plan of care  -patient is more alert today and tolerating sips and bites, at this time is is not eligible for an inpatient facility.  As we discussed yesterday, Plan B would have  pateint discharge home with hospice services and then [facility]  -family needs until tomorrow to get logistics in place for caregivers   Objective: No Known Allergies Scheduled Meds:  Continuous Infusions:  PRN Meds:.LORazepam, morphine injection  BP 153/62  Pulse 89  Temp(Src) 98.6 F (37 C) (Oral)  Resp 24  Ht 5' (1.524 m)  Wt 38.5 kg (84 lb 14 oz)  BMI 16.58 kg/m2  SpO2 85%   PPS:30 % at best    Intake/Output Summary (Last 24 hours) at 09/02/13 1005 Last data filed at 09/02/13 0900  Gross per 24 hour  Intake 638.17 ml  Output   1100 ml  Net -461.83 ml       Physical Exam:  General: chronically ill appearing, NAD  HEENT: Dry buccal membranes, no exudate  Chest: diminished in bases CTA  CVS: RRR  Abdomen:soft +BS  Ext: without edema  Neuro: Weak and lethargic, oriented to person only, follows simple commands   Labs: CBC    Component Value Date/Time   WBC 18.9* 09/01/2013 0405   RBC 4.20 09/01/2013 0405   HGB 12.1 09/01/2013 0405   HCT 37.6 09/01/2013 0405   PLT 247 09/01/2013 0405   MCV 89.5 09/01/2013 0405   MCH 28.8 09/01/2013 0405   MCHC 32.2 09/01/2013 0405   RDW 13.3 09/01/2013 0405   LYMPHSABS 1.2 09/01/2013 0405   MONOABS 1.7* 09/01/2013 0405   EOSABS 0.0 09/01/2013 0405   BASOSABS 0.0 09/01/2013 0405    BMET    Component Value Date/Time   NA 150* 09/01/2013 0840   K 3.8 09/01/2013 0840   CL 103 09/01/2013 0840   CO2 36* 09/01/2013 0840   GLUCOSE 132* 09/01/2013 0840   BUN 34* 09/01/2013 0840   CREATININE 0.79 09/01/2013 0840   CALCIUM 8.6 09/01/2013 0840   GFRNONAA 77*  09/01/2013 0840   GFRAA 89* 09/01/2013 0840    CMP     Component Value Date/Time   NA 150* 09/01/2013 0840   K 3.8 09/01/2013 0840   CL 103 09/01/2013 0840   CO2 36* 09/01/2013 0840   GLUCOSE 132* 09/01/2013 0840   BUN 34* 09/01/2013 0840   CREATININE 0.79 09/01/2013 0840   CALCIUM 8.6 09/01/2013 0840   PROT 7.2 08/31/2013 0245   ALBUMIN 3.5 08/31/2013 0245   AST 19 08/31/2013 0245   ALT 8 08/31/2013 0245   ALKPHOS 87 08/31/2013 0245   BILITOT 0.3 08/31/2013 0245   GFRNONAA 77* 09/01/2013 0840   GFRAA 89* 09/01/2013 0840     Assessment and Plan: 1. Code Status: DNR/DNI-comfort  is main focus of care 2. Symptom Control:       Anxiety: Ativan 0.5 mg PO TID and prn as needed  3. Psycho/Social:  Emotional support to daughter, family hope for comfort and dignity for this aptietn.  They understand the overall long term poor prognosis  4. Disposition:   Hopeful for discharge home with hospice, with plan to [facility] when eleig[facility]  Patient Documents Completed or Given: Document Given Completed  Advanced Directives Pkt    MOST  yes  DNR  Gone from My Sight    Hard Choices yes     Time In Time Out Total Time Spent with Patient Total Overall Time  0915 0950 35 min 35 min    Greater than 50%  of this time was spent counseling and coordinating care related to the above assessment and plan.  Lorinda CreedMary Larach NP  Palliative Medicine Team Team Phone # [phone] Pager [phone]  Discussed with Dr Butler Denmarkizwan 1

## 2013-09-02 NOTE — Consult Note (Signed)
I have reviewed this case with our NP and agree with the Assessment and Plan as stated.  Melissa L. Taylor, MD MBA The Palliative Medicine Team at [facility] Team Phone: [phone] Pager: [phone]   

## 2013-09-02 NOTE — Progress Notes (Signed)
Note: This document was prepared with digital dictation and possible smart phrase technology. Any transcriptional errors that result from this process are unintentional.   Janet Frazier CXK:[id number] DOB: Apr 26, 1930 DOA: 08/30/2013 PCP: Thressa Sheller, MD  Brief narrative: 80 y/o ?, known h/o mod-severe dementia [last mmse 18/30 on 05/21/13], IDDM + retinopathy, Htn,chronic LBP-Hip surgery 03/25/13,multiple thoracic #s admitted from home with reported multiple episodes N/V-no reported diarrhea. Her emesis was found to be dark but gastroccult not collected.   Because her sugar was elevated as well it was presumed her combined acidosis was 2/2 to DKA or starvation ketosis Found to have AG=22 in admit along with metabolic alkalosis with concomitant probable respiratory component and lactic acid 3.5 without source    Consultants: Palliative care Procedures:  none  Antibiotics:  none   Subjective   Alert, confused- no complaints.     Objective    Telemetry:    Objective: Filed Vitals:   09/02/13 0400 09/02/13 0743 09/02/13 0800 09/02/13 1112  BP: 171/66 179/79 153/62 155/57  Pulse: 89 103 89 79  Temp: 97.5 F (36.4 C) 98.6 F (37 C)  97.9 F (36.6 C)  TempSrc: Axillary Oral  Oral  Resp: 24   24  Height:      Weight:      SpO2: 88% 95% 85% 88%    Intake/Output Summary (Last 24 hours) at 09/02/13 1657 Last data filed at 09/02/13 0900  Gross per 24 hour  Intake 216.67 ml  Output    300 ml  Net -83.33 ml    Exam:  General:alert, no distress Cardiovascular: s1 s2 no m/r/g Respiratory: clear, no added sound Abdomen: soft - nontender Skin no le edema Neuro intact.  Moves all 4 limbs =  Data Reviewed: Basic Metabolic Panel:  Recent Labs Lab 08/30/13 2028 08/31/13 0245 08/31/13 0830 08/31/13 1942 09/01/13 0840  NA 150* 150* 147 148* 150*  K 3.9 4.0 3.7 3.3* 3.8  CL 99 97 96 98 103  CO2 40* 39* 37* 38* 36*  GLUCOSE 244* 136* 203* 235* 132*  BUN  34* 40* 43* 47* 34*  CREATININE 1.20* 1.64* 1.59* 1.26* 0.79  CALCIUM 9.4 8.9 8.9 8.7 8.6  MG  --   --   --  1.7  --    Liver Function Tests:  Recent Labs Lab 08/30/13 1045 08/30/13 1357 08/31/13 0245  AST _0 ALT _1 ALKPHOS 107 103 87  BILITOT 0.4 0.3 0.3  PROT 8.0 8.0 7.2  ALBUMIN 3.9 3.8 3.5   No results found for this basename: LIPASE, AMYLASE,  in the last 168 hours No results found for this basename: AMMONIA,  in the last 168 hours CBC:  Recent Labs Lab 08/30/13 1045 08/30/13 1450 08/31/13 0245 09/01/13 0405  WBC 13.6* 14.5* 19.6* 18.9*  NEUTROABS 12.1*  --   --  16.0*  HGB 15.0 14.5 12.7 12.1  HCT 45.1 43.9 39.9 37.6  MCV 86.6 86.2 87.9 89.5  PLT 317 338 341 247   Cardiac Enzymes:  Recent Labs Lab 08/30/13 1045  TROPONINI <0.30   BNP: No components found with this basename: POCBNP,  CBG:  Recent Labs Lab 08/31/13 1604 08/31/13 2038 09/01/13 0051 09/01/13 0444 09/01/13 0825  GLUCAP 210* 217* 186* 142* 134*    Recent Results (from the past 240 hour(s))  MRSA PCR SCREENING     Status: None   Collection Time    08/30/13  4:36 PM  Result Value Ref Range Status   MRSA by PCR NEGATIVE  NEGATIVE Final   Comment:            The GeneXpert MRSA Assay (FDA     approved for NASAL specimens     only), is one component of a     comprehensive MRSA colonization     surveillance program. It is not     intended to diagnose MRSA     infection nor to guide or     monitor treatment for     MRSA infections.     Studies:              All Imaging reviewed and is as per above notation   Scheduled Meds: . LORazepam  0.5 mg Oral TID   Continuous Infusions:     Assessment/Plan: Family has decided on comfort care for her mother after speaking with palliative care. All medications stopped except for comfort meds.   1. A fib c RVR-converted to NSR.  Probably due to stress of bleed/electrolyte shift.   2. potential UGIB as Abd discomfort,  dark vomit and drop in Hb 15-->11-place on protonix IV q 12.  GI and family to discuss r/b/a to endoscopy/colonoscopy 3. Mixed met alkalosis with resp acidosis 4.   Acute kidney injury-elevated bun multifactorial- 5. Potential Aspiration PNA-w 6. DM ty 2.   7. Mod-severe dementia MMSE 18/30.   8. Hypokalemia-resolved 9. Prior hip fracture-  Code Status: DNR Family Communication: Dr Verlon Au had long discussion with all 3 daughters.  W Disposition Plan: home with hospice   Debbe Odea, MD  [facility]  09/02/2013, 4:57 PM    LOS: 3 days

## 2013-09-02 NOTE — Progress Notes (Signed)
Notified by [facility], patient and family request services of [facility] after discharge.  Patient information reviewed with Janet Frazier, [facility] Medical Director hospice eligibility confirmed.  Patient seen at bedside opens eyes to voice shook head yes and no appropriately to questions about pain/comfort, but did not answer verbally. No family present. Call placed to daughter Janet Frazier [phone]) and also spoke with daughter Janet Frazier to initiate education related to hospice services, philosophy and team approach to care; they voiced good understanding of information provided and shared that their [place] and their main concern was that his pain was not controlled. Janet HaleMary Beth and Janet Frazier both voiced that they want their mother to be comfortable and wish for [facility] as appropriate. They are aware this writer will make the [facility] team aware of their request and they were also encouraged to further discuss with the assessment RN and team once home.  *Daughters ask the following: 1) for Foley catheter to be left in at discharge; 2) request Oxygen at home for comfort given pt's sats in mid 80s on RA and RR=22-24 this afternoon and 3) asked that patient also be given some Ativan prior to discharge to help with pt comfort on transport; this will be relayed to the staff caring for patient. *  Per notes plan [facility] Thursday 09/03/13 * Please send GOLD DNR Form home with pt  DME needs discussed with [facility] equipment manager Janet Frazier who will contact [facility] to request delivery to the home today: Oxygen Concentrator O2 @ 2LNC prn SOB/respiratory distress; and XComplete package D: fully electric hospital bed with AP&P mattress, full rails and over-bed table  [facility]  Please notify [facility] when patient is ready to leave unit at d/c call [phone] (or [phone]  if after 5 pm);  [facility] information and contact numbers also given to daughter during telephone discussion.   Above information will be shared with [facility]  Please call with any questions or concerns   Janet DavidMargie Francois Elk, RN 09/02/2013, 5:57 PM [facility] RN Liaison [phone]

## 2013-09-02 NOTE — Care Management Note (Signed)
  Page 2 of 2   09/02/2013     2:07:44 PM CARE MANAGEMENT NOTE 09/02/2013  Patient:  Janet Frazier,Janet Frazier   Account Number:  [id number]  Date Initiated:  09/02/2013  Documentation initiated by:  MAYO,HENRIETTA  Subjective/Objective Assessment:   dx N/V; lives with dtr    PCP  Thayer HeadingsBrian Mackenzie     Action/Plan:   Anticipated DC Date:     Anticipated DC Plan:  HOME W HOSPICE CARE  In-house referral  Clinical Social Worker      DC Planning Services  CM consult      Choice offered to / List presented to:  C-4 Adult Children           HH agency  [facility] [place]   Status of service:   Medicare Important Message given?  YES (If response is "NO", the following Medicare IM given date fields will be blank) Date Medicare IM given:  09/02/2013 Medicare IM given by:  MAYO,HENRIETTA Date Additional Medicare IM given:   Additional Medicare IM given by:    Discharge Disposition:    Per UR Regulation:    If discussed at Long Length of Stay Meetings, dates discussed:    Comments:  09-02-13 Spoke with patient's daughter Shon HaleMary Beth [phone]. Shon HaleMary Beth confused regarding  discharged plan , not sure if home with hospice or residental hospice . Spoke with Corrie DandyMary NP with Palliative Care . Mary NP had long conservation this am with daughter Eber JonesCarolyn and plan is home with hospice then transition to residental hospice . Shon HaleMary Beth aware of that convervation and agreeable to home with hospice .  [facility] [place]  chosen by [facility] , referral made . Patient will need ambulance transportation home tomorrow .  Bedside nurse checking oxygen sat . Shon HaleMary Beth requesting hospital bed . [facility] aware .  Patient will be discharging to address on face sheet . Shon HaleMary Beth stated Eber JonesCarolyn and her will provide 24 hour assistance .   Ronny FlurryHeather Kenneshia Rehm RN BSN [phone]

## 2013-09-03 DIAGNOSIS — K920 Hematemesis: Secondary | ICD-10-CM

## 2013-09-03 DIAGNOSIS — R110 Nausea: Secondary | ICD-10-CM

## 2013-09-03 DIAGNOSIS — E876 Hypokalemia: Secondary | ICD-10-CM

## 2013-09-03 MED ORDER — LORAZEPAM 0.5 MG PO TABS
0.5000 mg | ORAL_TABLET | Freq: Four times a day (QID) | ORAL | Status: DC | PRN
  Administered 2013-09-03: 0.5 mg via ORAL

## 2013-09-03 MED ORDER — LORAZEPAM 0.5 MG PO TABS: 0.5000 mg | ORAL_TABLET | Freq: Four times a day (QID) | ORAL | Status: AC | PRN

## 2013-09-03 MED ORDER — LORAZEPAM 0.5 MG PO TABS: 0.5000 mg | ORAL_TABLET | Freq: Three times a day (TID) | ORAL | Status: AC

## 2013-09-03 MED ORDER — LORAZEPAM 0.5 MG PO TABS: 0.5000 mg | ORAL_TABLET | Freq: Three times a day (TID) | ORAL | Status: DC | PRN

## 2013-09-03 NOTE — Clinical Social Work Note (Signed)
Clinical Social Worker received referral from [facility] regarding patient transport home with hospice.  Patient family has made all arrangements with [facility] and plan for patient to return home.  CSW confirmed address with patient family at bedside.  Paperwork placed on shadow chart.  RN to call for ambulance transport home once patient ready for transport.  Clinical Social Worker will sign off for now as social work intervention is no longer needed. Please consult us again if new need arises.  Macario GoldsJesse Annia Gomm, KentuckyLCSW [phone]

## 2013-09-03 NOTE — Discharge Summary (Signed)
Physician Discharge Summary  Janet Frazier:[id number] DOB: 03-29-1930 DOA: 08/30/2013  PCP: Thressa Sheller, MD  Admit date: 08/30/2013 Discharge date: 09/03/2013  Time spent: >45 minutes  Recommendations for Outpatient Follow-up:  1. Going home with Hospice, foley cath and  2 L of O2 PRN  Discharge Diagnoses:  Principal Problem:   Nausea & vomiting- probably hematemesis  Active Problems:    Hypernatremia- dehydration   Acidosis, metabolic   Hypokalemia   Recurrent falls   Essential hypertension, benign   Diabetes mellitus   Closed right hip fracture   CHF (congestive heart failure)   Neuropathy   Carpal tunnel syndrome   Memory loss   Back pain Anorexia and weight loss    Discharge Condition: stable  Diet recommendation: as tolerated  Filed Weights   08/30/13 1643 08/31/13 0601  Weight: 38.754 kg (85 lb 7 oz) 38.5 kg (84 lb 14 oz)    History of present illness:  79 y/o ?, known h/o mod-severe dementia [last mmse 18/30 on 05/21/13], IDDM + retinopathy, Htn,chronic LBP-Hip surgery 03/25/13,multiple thoracic #s admitted from home with reported multiple episodes N/V-no reported diarrhea. Patientunable to get full history. Appears patient was in ED a couple of days ago with some pain in knee and ultimately came to the ED. Was given K tablets which makes her sick. Hasn't eaten much as she usually does-noticed sudden episodes of vomiting 6/27 pm. Daughter noted vomit in the bed-this appeared to be dark in nature and this worried her.  Sugars haven't been checked at home but usually run in the 300's and when she was in rehab the insulin was increased  She has a h/o of lows on her sugars recently as well  Her urine always seems to be foul.  She has also been coughing as well wish is new ~ 1 day ago.  Last GI seen was in [place] 2 yrs ago-she has a h/o crohn's as well  Patient is confused at bedside and cannot give a good history  Point care fecal occult negative  Chest x-ray =  nonobstructing bowel gas pattern potential opacity right upper chest anteriorly  Sodium 150 potassium 3.3 chloride 93 CO2 35 urine 24 creatinine 0.5 Exline baseline BUN/creatinine 11/2.4   It is also noted that she has had physical, functional and cognitive decline over the past year, significantly, since hip fx in January 2015. Weight loss of 40-50 pounds in 9 months, frequent falls, worsening cognition.   Hospital Course:  1. Failure to thrive, anorexia, weight loss- family has decided on comfort care and will be taking her home with hospice 2. A fib c RVR-converted to NSR. Probably due to stress of electrolyte shift and GI bleed  3. potential UGIB as Abd discomfort, dark vomit and drop in Hb 15-->11-place on protonix IV q 12. GI consulted and spoke with family about further procedures- the patient's daughter did not want any further work up. 4. Mixed met alkalosis with resp acidosis 5. Acute kidney injury- 6. Potential Aspiration PNA- 7. DM ty 2.  8. Mod-severe dementia MMSE 18/30.  9. Hypokalemia- 10. Prior hip fracture-   Procedures: none  Consultations:  GI   Palliative care  Discharge Exam: Filed Vitals:   09/03/13 0552  BP: 139/67  Pulse: 98  Temp: 98.7 F (37.1 C)  Resp: 22    General: alert, disoriented Cardiovascular: RRR, Respiratory: CTA b/l   Discharge Instructions You were cared for by a hospitalist during your hospital stay. If you have any questions  about your discharge medications or the care you received while you were in the hospital after you are discharged, you can call the unit and asked to speak with the hospitalist on call if the hospitalist that took care of you is not available. Once you are discharged, your primary care physician will handle any further medical issues. Please note that NO REFILLS for any discharge medications will be authorized once you are discharged, as it is imperative that you return to your primary care physician (or  establish a relationship with a primary care physician if you do not have one) for your aftercare needs so that they can reassess your need for medications and monitor your lab values.     Medication List    STOP taking these medications       insulin glargine 100 UNIT/ML injection  Commonly known as:  LANTUS      TAKE these medications       LORazepam 0.5 MG tablet  Commonly known as:  ATIVAN  Take 1 tablet (0.5 mg total) by mouth every 8 (eight) hours as needed for anxiety.     OxyCODONE HCl (Abuse Deter) 5 MG Taba  Take 5 mg by mouth daily as needed (PAIN).     QUEtiapine 25 MG tablet  Commonly known as:  SEROQUEL  1/2 to one tab po qhs       No Known Allergies    The results of significant diagnostics from this hospitalization (including imaging, microbiology, ancillary and laboratory) are listed below for reference.    Significant Diagnostic Studies: Dg Pelvis 1-2 Views  08/28/2013   CLINICAL DATA:  Pelvic soreness post fall, BILATERAL knee pain RIGHT greater than LEFT  EXAM: PELVIS - 1-2 VIEW  COMPARISON:  03/25/2013  FINDINGS: IM nail and compression screws at proximal RIGHT femur post ORIF of an intertrochanteric fracture.  Hardware incompletely visualized.  Diffuse osseous demineralization.  Prior vertebroplasty of L4.  Hip joints and SI joints grossly preserved.  No acute fracture, dislocation or bone destruction.  Question old healed fracture of the LEFT pubic body.  Scattered vascular calcifications.  IMPRESSION: Osseous demineralization with prior L4 vertebroplasty and proximal RIGHT femoral ORIF.  No definite acute abnormalities.  Suspect old healed LEFT pubic body fracture.   Electronically Signed   By: Lavonia Dana M.D.   On: 08/28/2013 12:24   Dg Abd 1 View  08/31/2013   CLINICAL DATA:  Feeding tube placement.  EXAM: ABDOMEN - 1 VIEW  COMPARISON:  Chest in two views abdomen 08/30/2013.  FINDINGS: Single fluoroscopic spot view of the upper abdomen demonstrates an  NG tube is in place with the tip projecting in the distal stomach.  IMPRESSION: Feeding tube in good position.   Electronically Signed   By: Inge Rise M.D.   On: 08/31/2013 10:16   Dg Chest Port 1 View  08/31/2013   CLINICAL DATA:  Cough and shortness of breath  EXAM: PORTABLE CHEST - 1 VIEW  COMPARISON:  08/30/2013  FINDINGS: Cardiac shadow is stable. Mitral calcifications are again noted. Diffuse interstitial changes are seen in both lungs. Some clearing in the right upper lobe is noted. No new focal infiltrate is seen. No bony abnormality is noted.  IMPRESSION: Interval mild clearing in the right apex. No new focal abnormality is seen.   Electronically Signed   By: Inez Catalina M.D.   On: 08/31/2013 08:17   Dg Knee Complete 4 Views Left  08/28/2013   CLINICAL DATA:  Fall  with bilateral knee pain right worse than left.  EXAM: LEFT KNEE - COMPLETE 4+ VIEW  COMPARISON:  None.  FINDINGS: There is diffuse decreased bone mineralization. There is no evidence of fracture or dislocation. No evidence of joint effusion. Minimal osteoarthritic changes present. There is moderate atherosclerotic disease.  IMPRESSION: No acute findings.   Electronically Signed   By: Marin Olp M.D.   On: 08/28/2013 12:22   Dg Knee Complete 4 Views Right  08/28/2013   CLINICAL DATA:  Traumatic injury and pain  EXAM: RIGHT KNEE - COMPLETE 4+ VIEW  COMPARISON:  03/25/2013  FINDINGS: There are changes consistent with a prior chronic lateral tibial plateau fracture. A medullary rod is noted within the distal femur. Diffuse vascular calcifications are seen. No joint effusion or acute fracture is noted.  IMPRESSION: Chronic changes without acute abnormality.   Electronically Signed   By: Inez Catalina M.D.   On: 08/28/2013 12:21   Dg Abd Acute W/chest  08/30/2013   CLINICAL DATA:  Vomiting  EXAM: ACUTE ABDOMEN SERIES (ABDOMEN 2 VIEW & CHEST 1 VIEW)  COMPARISON:  03/24/2013  FINDINGS: There is opacity at the medial right apex  adjacent to the trachea. Hazy airspace disease in the remainder of the right upper lobe. Minimal linear atelectasis at the left base. Normal heart size. No pneumothorax.  There is no free intraperitoneal gas. Stabilization hardware in the right femur. Bone cement in L3 and L4. Osteopenia. No disproportionate dilatation of bowel.  IMPRESSION: Nonobstructive bowel gas pattern.  Right apical pulmonary opacity as described. This may represent a airspace disease. Follow-up studies until resolution are recommended.   Electronically Signed   By: Maryclare Bean M.D.   On: 08/30/2013 13:17   Dg Addison Bailey G Tube Plc W/fl-no Rad  08/31/2013   CLINICAL DATA: continued vomiting; asp pna; prog GI bleed per Dr Verlon Au   NASO G TUBE PLACEMENT WITH FLUORO  Fluoroscopy was utilized by the requesting physician.  No radiographic  interpretation.     Microbiology: Recent Results (from the past 240 hour(s))  MRSA PCR SCREENING     Status: None   Collection Time    08/30/13  4:36 PM      Result Value Ref Range Status   MRSA by PCR NEGATIVE  NEGATIVE Final   Comment:            The GeneXpert MRSA Assay (FDA     approved for NASAL specimens     only), is one component of a     comprehensive MRSA colonization     surveillance program. It is not     intended to diagnose MRSA     infection nor to guide or     monitor treatment for     MRSA infections.     Labs: Basic Metabolic Panel:  Recent Labs Lab 08/30/13 2028 08/31/13 0245 08/31/13 0830 08/31/13 1942 09/01/13 0840  NA 150* 150* 147 148* 150*  K 3.9 4.0 3.7 3.3* 3.8  CL 99 97 96 98 103  CO2 40* 39* 37* 38* 36*  GLUCOSE 244* 136* 203* 235* 132*  BUN 34* 40* 43* 47* 34*  CREATININE 1.20* 1.64* 1.59* 1.26* 0.79  CALCIUM 9.4 8.9 8.9 8.7 8.6  MG  --   --   --  1.7  --    Liver Function Tests:  Recent Labs Lab 08/30/13 1045 08/30/13 1357 08/31/13 0245  AST _0 ALT _1 ALKPHOS 107 103 87  BILITOT 0.4 0.3 0.3  PROT 8.0 8.0 7.2  ALBUMIN 3.9  3.8 3.5   No results found for this basename: LIPASE, AMYLASE,  in the last 168 hours No results found for this basename: AMMONIA,  in the last 168 hours CBC:  Recent Labs Lab 08/30/13 1045 08/30/13 1450 08/31/13 0245 09/01/13 0405  WBC 13.6* 14.5* 19.6* 18.9*  NEUTROABS 12.1*  --   --  16.0*  HGB 15.0 14.5 12.7 12.1  HCT 45.1 43.9 39.9 37.6  MCV 86.6 86.2 87.9 89.5  PLT 317 338 341 247   Cardiac Enzymes:  Recent Labs Lab 08/30/13 1045  TROPONINI <0.30   BNP: BNP (last 3 results) No results found for this basename: PROBNP,  in the last 8760 hours CBG:  Recent Labs Lab 08/31/13 1604 08/31/13 2038 09/01/13 0051 09/01/13 0444 09/01/13 0825  GLUCAP 210* 217* 186* 142* 134*       Signed:  [place]  [facility] 09/03/2013, 9:38 AM

## 2013-09-03 NOTE — Progress Notes (Signed)
Discharge instructions/prescriptions for ativan given and explained to pt's daughter.  IV removed.  All questions answered.  Pt discharged to home via [facility] with all belongings . Vanice Sarahhompson, Ricardo Kayes L

## 2013-10-03 DEATH — deceased

## 2013-12-23 ENCOUNTER — Ambulatory Visit: Payer: Medicare Other | Admitting: Nurse Practitioner

## 2014-03-03 ENCOUNTER — Encounter: Payer: Self-pay | Admitting: Internal Medicine

## 2014-03-18 ENCOUNTER — Encounter (HOSPITAL_COMMUNITY): Payer: Self-pay | Admitting: Orthopaedic Surgery

## 2014-11-13 IMAGING — CR DG ABDOMEN ACUTE W/ 1V CHEST
3 series · 3 of 3 positions shown · non-contrast
Comparison: 03/24/2013

CLINICAL DATA: Vomiting

EXAM:
ACUTE ABDOMEN SERIES (ABDOMEN 2 VIEW & CHEST 1 VIEW)

[t abdomen supine]
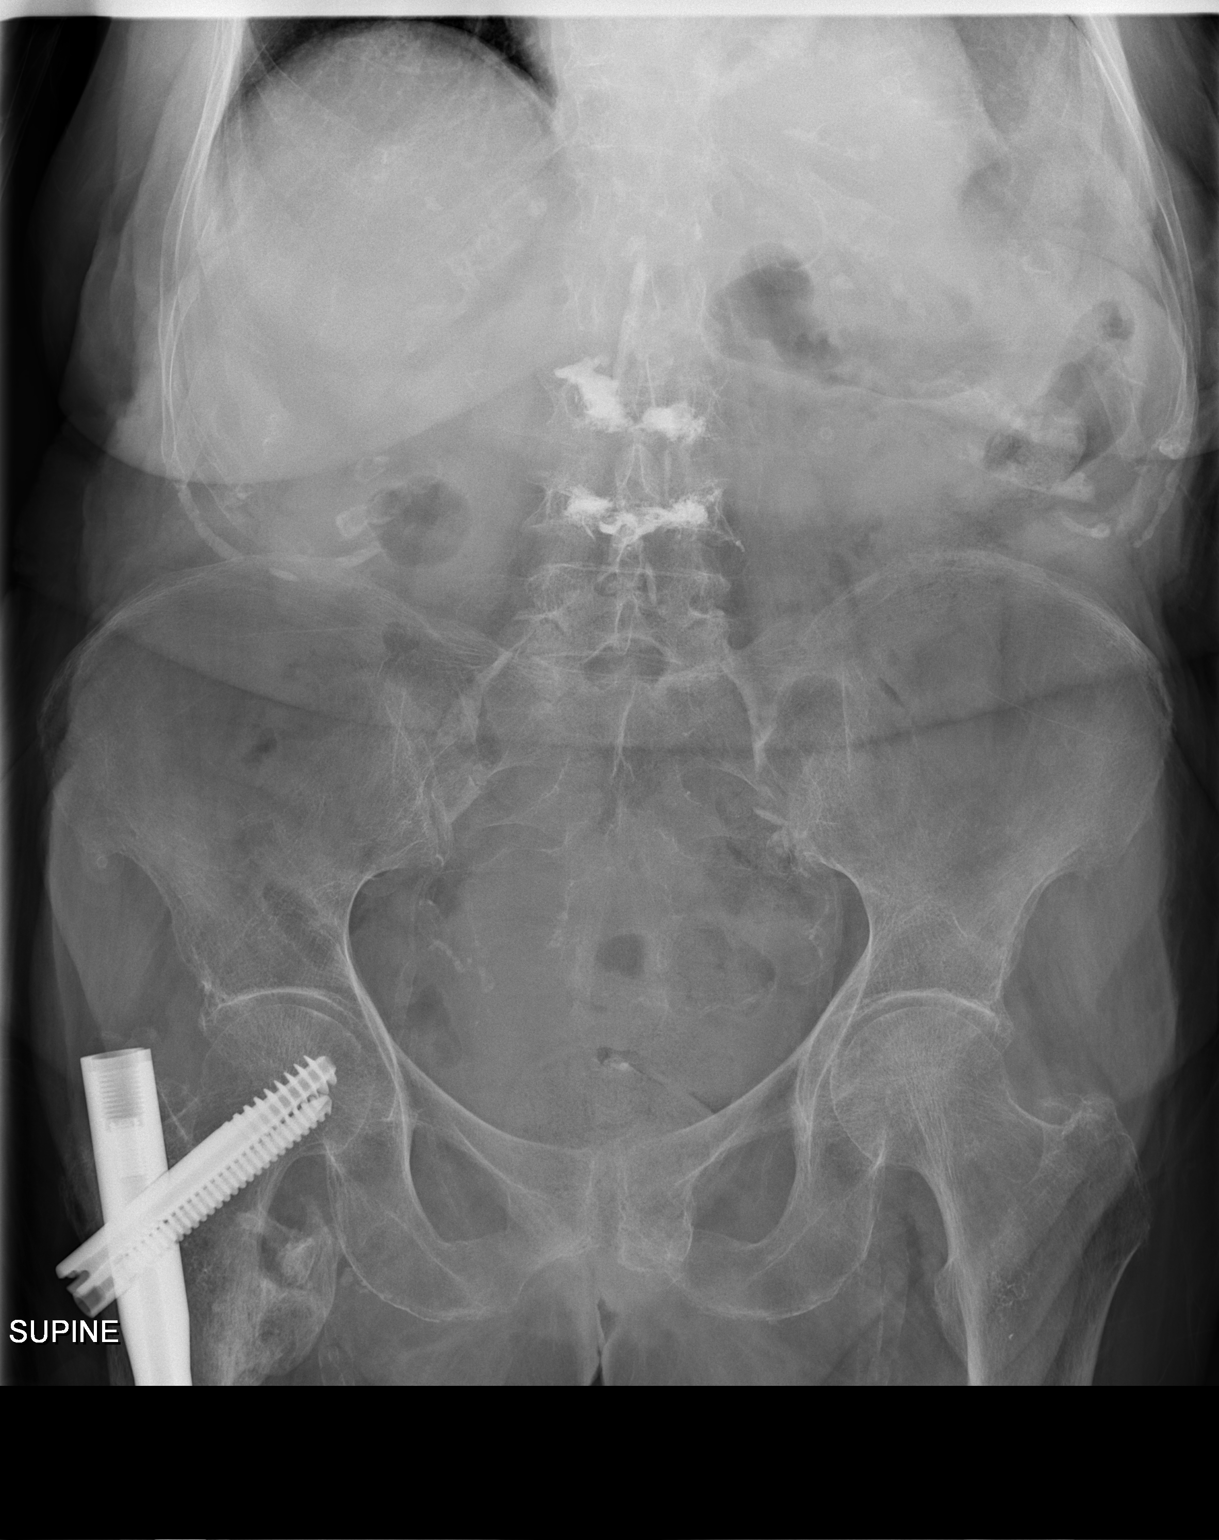

[x abdomen decub]
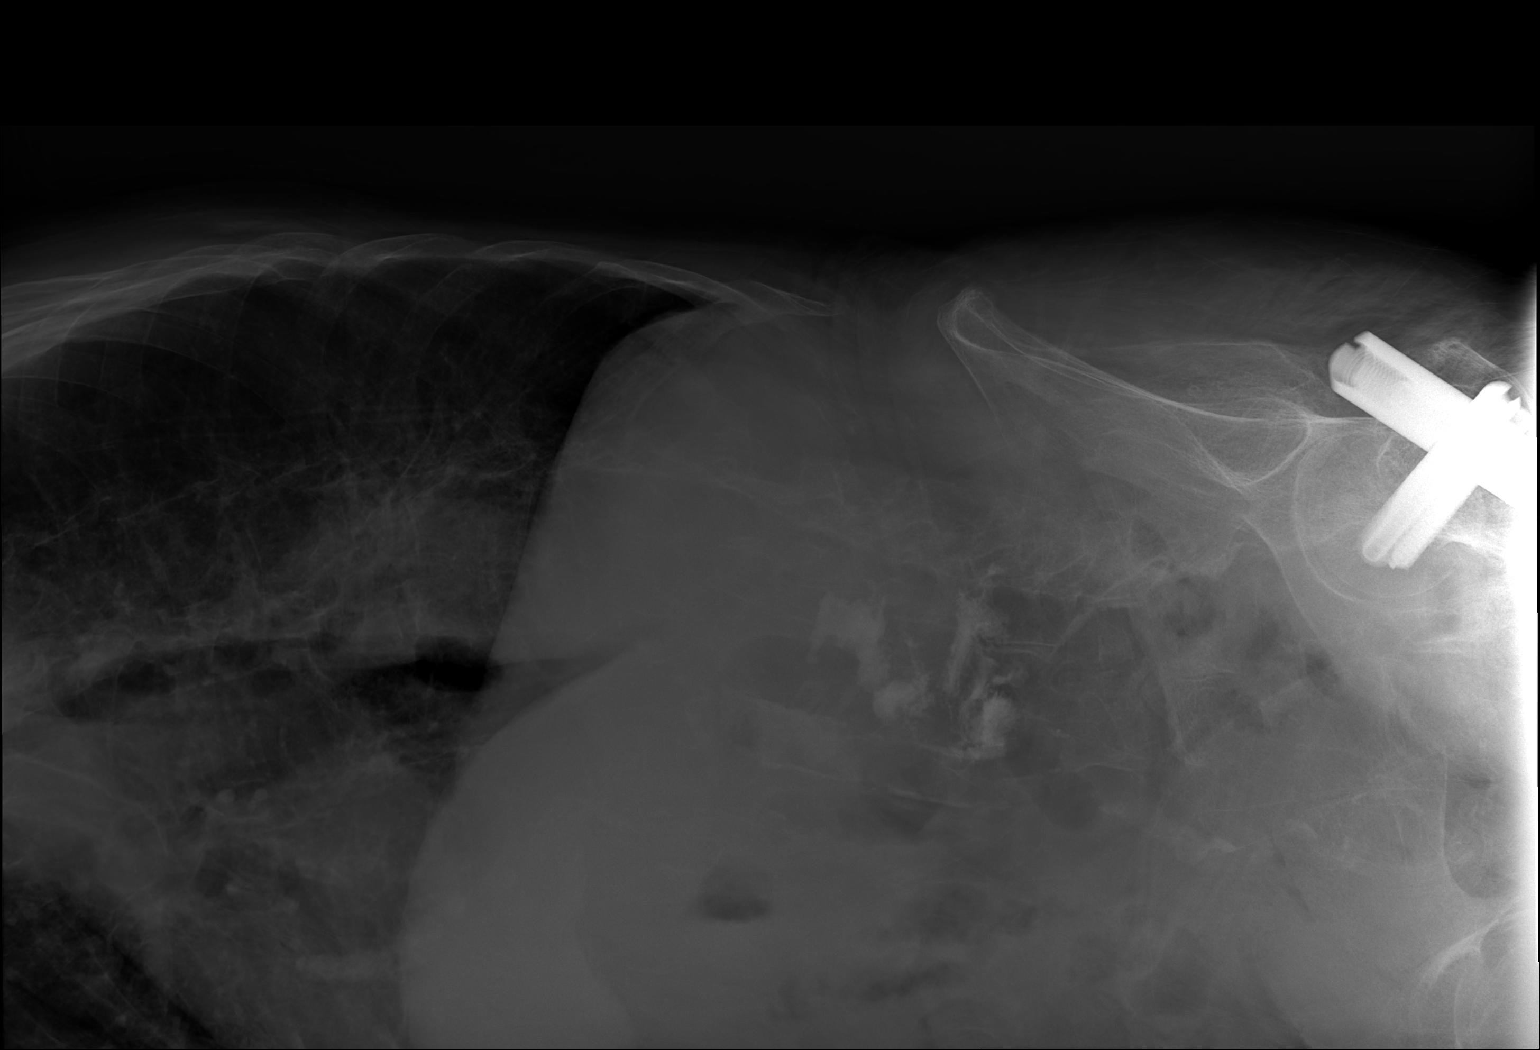

[x chest ap]
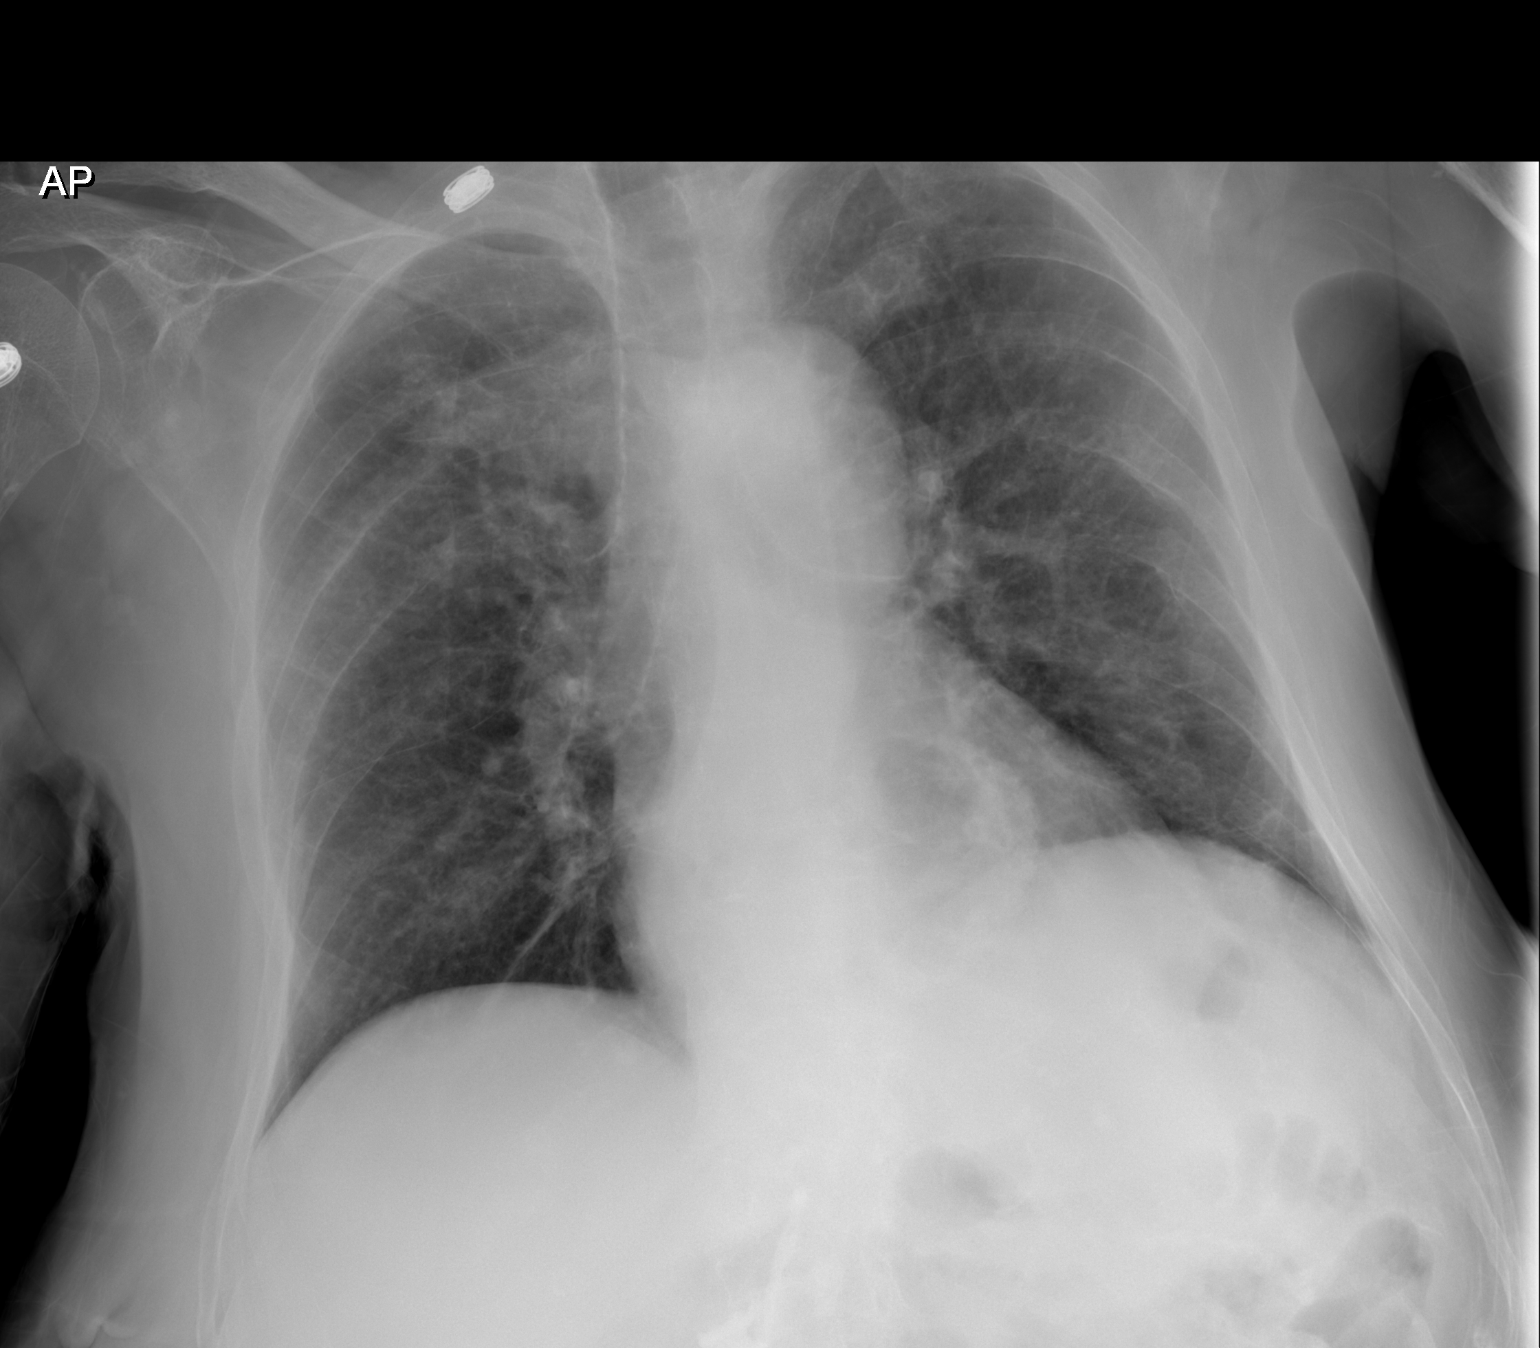

[3 of 3 positions shown; findings below may reference images not displayed]

FINDINGS: There is opacity at the medial right apex adjacent to the trachea.
Hazy airspace disease in the remainder of the right upper lobe.
Minimal linear atelectasis at the left base. Normal heart size. No
pneumothorax.

There is no free intraperitoneal gas. Stabilization hardware in the
right femur. Bone cement in L3 and L4. Osteopenia. No
disproportionate dilatation of bowel.
IMPRESSION: Nonobstructive bowel gas pattern.

Right apical pulmonary opacity as described. This may represent a
airspace disease. Follow-up studies until resolution are
recommended.
